# Patient Record
Sex: Female | Born: 1945
Health system: Southern US, Community
[De-identification: ages and names within clinical notes are randomized; demographics above are authoritative.]

## PROBLEM LIST (undated history)

## (undated) DIAGNOSIS — J45909 Unspecified asthma, uncomplicated: Secondary | ICD-10-CM

## (undated) DIAGNOSIS — C4490 Unspecified malignant neoplasm of skin, unspecified: Secondary | ICD-10-CM

## (undated) DIAGNOSIS — I10 Essential (primary) hypertension: Secondary | ICD-10-CM

## (undated) DIAGNOSIS — E039 Hypothyroidism, unspecified: Secondary | ICD-10-CM

## (undated) DIAGNOSIS — Z973 Presence of spectacles and contact lenses: Secondary | ICD-10-CM

## (undated) DIAGNOSIS — I4891 Unspecified atrial fibrillation: Secondary | ICD-10-CM

## (undated) DIAGNOSIS — E785 Hyperlipidemia, unspecified: Secondary | ICD-10-CM

## (undated) DIAGNOSIS — M1990 Unspecified osteoarthritis, unspecified site: Secondary | ICD-10-CM

## (undated) DIAGNOSIS — R011 Cardiac murmur, unspecified: Secondary | ICD-10-CM

## (undated) HISTORY — PX: TUBAL LIGATION: SHX77

## (undated) HISTORY — PX: COLONOSCOPY: SHX174

## (undated) HISTORY — PX: SKIN CANCER EXCISION: SHX779

---

## 2000-01-05 HISTORY — PX: ABDOMINAL HYSTERECTOMY: SHX81

## 2003-01-05 HISTORY — PX: CARPAL TUNNEL RELEASE: SHX101

## 2010-01-04 HISTORY — PX: TOTAL SHOULDER ARTHROPLASTY: SHX126

## 2012-01-05 HISTORY — PX: GREAT TOE ARTHRODESIS, INTERPHALANGEAL JOINT: SUR55

## 2013-01-04 HISTORY — PX: PLANTAR FASCIA SURGERY: SHX746

## 2013-01-04 HISTORY — PX: GREAT TOE ARTHRODESIS, JONES PROCEDURE: SUR57

## 2013-08-24 ENCOUNTER — Other Ambulatory Visit: Payer: Self-pay | Admitting: Orthopedic Surgery

## 2013-08-27 ENCOUNTER — Encounter (HOSPITAL_BASED_OUTPATIENT_CLINIC_OR_DEPARTMENT_OTHER): Payer: Self-pay | Admitting: *Deleted

## 2013-08-27 NOTE — Progress Notes (Signed)
To go to [facility] for [id number] 

## 2013-08-28 ENCOUNTER — Encounter (HOSPITAL_COMMUNITY)
Admission: RE | Admit: 2013-08-28 | Discharge: 2013-08-28 | Disposition: A | Discharge status: Home / Self Care | Payer: Medicare Other | Source: Ambulatory Visit | Attending: Orthopedic Surgery | Admitting: Orthopedic Surgery

## 2013-08-28 DIAGNOSIS — I10 Essential (primary) hypertension: Secondary | ICD-10-CM | POA: Diagnosis not present

## 2013-08-28 DIAGNOSIS — M19049 Primary osteoarthritis, unspecified hand: Secondary | ICD-10-CM | POA: Diagnosis not present

## 2013-08-28 DIAGNOSIS — J45909 Unspecified asthma, uncomplicated: Secondary | ICD-10-CM | POA: Diagnosis not present

## 2013-08-28 DIAGNOSIS — M720 Palmar fascial fibromatosis [Dupuytren]: Secondary | ICD-10-CM | POA: Diagnosis present

## 2013-08-28 DIAGNOSIS — E039 Hypothyroidism, unspecified: Secondary | ICD-10-CM | POA: Diagnosis not present

## 2013-08-28 DIAGNOSIS — R011 Cardiac murmur, unspecified: Secondary | ICD-10-CM | POA: Diagnosis not present

## 2013-08-28 LAB — BASIC METABOLIC PANEL
Anion gap: 12 (ref 5–15)
BUN: 23 mg/dL (ref 6–23)
CALCIUM: 10.2 mg/dL (ref 8.4–10.5)
CHLORIDE: 99 meq/L (ref 96–112)
CO2: 33 meq/L — AB (ref 19–32)
CREATININE: 1.15 mg/dL — AB (ref 0.50–1.10)
GFR calc Af Amer: 55 mL/min — ABNORMAL LOW (ref >90)
GFR calc non Af Amer: 47 mL/min — ABNORMAL LOW (ref >90)
Glucose, Bld: 82 mg/dL (ref 70–99)
Potassium: 3.8 mEq/L (ref 3.7–5.3)
Sodium: 144 mEq/L (ref 137–147)

## 2013-08-28 LAB — HEMOGLOBIN: Hemoglobin: 13.6 g/dL (ref 12.0–15.0)

## 2013-08-30 ENCOUNTER — Encounter (HOSPITAL_BASED_OUTPATIENT_CLINIC_OR_DEPARTMENT_OTHER): Payer: Medicare Other | Admitting: Certified Registered"

## 2013-08-30 ENCOUNTER — Ambulatory Visit (HOSPITAL_BASED_OUTPATIENT_CLINIC_OR_DEPARTMENT_OTHER): Payer: Medicare Other | Admitting: Certified Registered"

## 2013-08-30 ENCOUNTER — Ambulatory Visit (HOSPITAL_BASED_OUTPATIENT_CLINIC_OR_DEPARTMENT_OTHER)
Admission: RE | Admit: 2013-08-30 | Discharge: 2013-08-30 | Disposition: A | Discharge status: Home / Self Care | Payer: Medicare Other | Source: Ambulatory Visit | Attending: Orthopedic Surgery | Admitting: Orthopedic Surgery

## 2013-08-30 ENCOUNTER — Encounter (HOSPITAL_BASED_OUTPATIENT_CLINIC_OR_DEPARTMENT_OTHER): Payer: Self-pay

## 2013-08-30 ENCOUNTER — Encounter (HOSPITAL_BASED_OUTPATIENT_CLINIC_OR_DEPARTMENT_OTHER)
Admission: RE | Disposition: A | Discharge status: Home / Self Care | Payer: Self-pay | Source: Ambulatory Visit | Attending: Orthopedic Surgery

## 2013-08-30 DIAGNOSIS — J45909 Unspecified asthma, uncomplicated: Secondary | ICD-10-CM | POA: Insufficient documentation

## 2013-08-30 DIAGNOSIS — I10 Essential (primary) hypertension: Secondary | ICD-10-CM | POA: Diagnosis not present

## 2013-08-30 DIAGNOSIS — M19049 Primary osteoarthritis, unspecified hand: Secondary | ICD-10-CM | POA: Insufficient documentation

## 2013-08-30 DIAGNOSIS — M720 Palmar fascial fibromatosis [Dupuytren]: Secondary | ICD-10-CM | POA: Diagnosis not present

## 2013-08-30 DIAGNOSIS — R011 Cardiac murmur, unspecified: Secondary | ICD-10-CM | POA: Insufficient documentation

## 2013-08-30 DIAGNOSIS — E039 Hypothyroidism, unspecified: Secondary | ICD-10-CM | POA: Diagnosis not present

## 2013-08-30 HISTORY — DX: Essential (primary) hypertension: I10

## 2013-08-30 HISTORY — DX: Hypothyroidism, unspecified: E03.9

## 2013-08-30 HISTORY — DX: Cardiac murmur, unspecified: R01.1

## 2013-08-30 HISTORY — DX: Unspecified osteoarthritis, unspecified site: M19.90

## 2013-08-30 HISTORY — DX: Unspecified asthma, uncomplicated: J45.909

## 2013-08-30 HISTORY — DX: Presence of spectacles and contact lenses: Z97.3

## 2013-08-30 LAB — POCT HEMOGLOBIN-HEMACUE: HEMOGLOBIN: 15.1 g/dL — AB (ref 12.0–15.0)

## 2013-08-30 SURGERY — FASCIECTOMY, PALM
Anesthesia: Monitor Anesthesia Care | Site: Hand | Laterality: Left

## 2013-08-30 MED ORDER — FENTANYL CITRATE 0.05 MG/ML IJ SOLN
INTRAMUSCULAR | Status: DC | PRN
  Administered 2013-08-30: 100 ug via INTRAVENOUS

## 2013-08-30 MED ORDER — FENTANYL CITRATE 0.05 MG/ML IJ SOLN
INTRAMUSCULAR | Status: AC
  Filled 2013-08-30: qty 2

## 2013-08-30 MED ORDER — LIDOCAINE HCL (CARDIAC) 20 MG/ML IV SOLN
INTRAVENOUS | Status: DC | PRN
  Administered 2013-08-30: 50 mg via INTRAVENOUS

## 2013-08-30 MED ORDER — MIDAZOLAM HCL 2 MG/2ML IJ SOLN
INTRAMUSCULAR | Status: AC
Start: 2013-08-30 — End: 2013-08-30
  Filled 2013-08-30: qty 2

## 2013-08-30 MED ORDER — HYDROCODONE-ACETAMINOPHEN 10-325 MG PO TABS: 1.0000 | ORAL_TABLET | Freq: Four times a day (QID) | ORAL | Status: DC | PRN

## 2013-08-30 MED ORDER — LACTATED RINGERS IV SOLN
INTRAVENOUS | Status: DC
  Administered 2013-08-30 (×2): via INTRAVENOUS

## 2013-08-30 MED ORDER — PROPOFOL 10 MG/ML IV BOLUS
INTRAVENOUS | Status: DC | PRN
  Administered 2013-08-30: 150 mg via INTRAVENOUS
  Administered 2013-08-30: 50 mg via INTRAVENOUS

## 2013-08-30 MED ORDER — SULFAMETHOXAZOLE-TMP DS 800-160 MG PO TABS: 1.0000 | ORAL_TABLET | Freq: Two times a day (BID) | ORAL | Status: DC

## 2013-08-30 MED ORDER — MIDAZOLAM HCL 5 MG/5ML IJ SOLN
INTRAMUSCULAR | Status: DC | PRN
  Administered 2013-08-30: 2 mg via INTRAVENOUS

## 2013-08-30 MED ORDER — CEFAZOLIN SODIUM-DEXTROSE 2-3 GM-% IV SOLR: 2.0000 g | INTRAVENOUS | Status: DC

## 2013-08-30 MED ORDER — METOCLOPRAMIDE HCL 5 MG/ML IJ SOLN
INTRAMUSCULAR | Status: AC
  Filled 2013-08-30: qty 2

## 2013-08-30 MED ORDER — OXYCODONE HCL 5 MG PO TABS: 5.0000 mg | ORAL_TABLET | Freq: Once | ORAL | Status: DC | PRN

## 2013-08-30 MED ORDER — OXYCODONE HCL 5 MG/5ML PO SOLN: 5.0000 mg | Freq: Once | ORAL | Status: DC | PRN

## 2013-08-30 MED ORDER — ONDANSETRON HCL 4 MG/2ML IJ SOLN: 4.0000 mg | Freq: Four times a day (QID) | INTRAMUSCULAR | Status: DC | PRN

## 2013-08-30 MED ORDER — CHLORHEXIDINE GLUCONATE 4 % EX LIQD: 60.0000 mL | Freq: Once | CUTANEOUS | Status: DC

## 2013-08-30 MED ORDER — MIDAZOLAM HCL 2 MG/2ML IJ SOLN
INTRAMUSCULAR | Status: AC
  Filled 2013-08-30: qty 2

## 2013-08-30 MED ORDER — METOCLOPRAMIDE HCL 5 MG/5ML PO SOLN
10.0000 mg | Freq: Three times a day (TID) | ORAL | Status: DC
  Administered 2013-08-30: 5 mg via ORAL

## 2013-08-30 MED ORDER — FENTANYL CITRATE 0.05 MG/ML IJ SOLN
25.0000 ug | INTRAMUSCULAR | Status: DC | PRN
  Administered 2013-08-30 (×2): 50 ug via INTRAVENOUS

## 2013-08-30 MED ORDER — THROMBIN 20000 UNITS EX KIT
PACK | CUTANEOUS | Status: DC | PRN
  Administered 2013-08-30: 20000 [IU] via TOPICAL

## 2013-08-30 MED ORDER — DEXAMETHASONE SODIUM PHOSPHATE 10 MG/ML IJ SOLN
INTRAMUSCULAR | Status: DC | PRN
  Administered 2013-08-30: 10 mg via INTRAVENOUS

## 2013-08-30 MED ORDER — MIDAZOLAM HCL 2 MG/2ML IJ SOLN
1.0000 mg | INTRAMUSCULAR | Status: DC | PRN
  Administered 2013-08-30: 1 mg via INTRAVENOUS

## 2013-08-30 MED ORDER — EPHEDRINE SULFATE 50 MG/ML IJ SOLN
INTRAMUSCULAR | Status: DC | PRN
  Administered 2013-08-30 (×3): 10 mg via INTRAVENOUS

## 2013-08-30 MED ORDER — BUPIVACAINE-EPINEPHRINE (PF) 0.5% -1:200000 IJ SOLN
INTRAMUSCULAR | Status: DC | PRN
  Administered 2013-08-30: 30 mL via PERINEURAL

## 2013-08-30 MED ORDER — CEFAZOLIN SODIUM-DEXTROSE 2-3 GM-% IV SOLR
2.0000 g | INTRAVENOUS | Status: AC
  Administered 2013-08-30: 2 g via INTRAVENOUS

## 2013-08-30 MED ORDER — CEFAZOLIN SODIUM-DEXTROSE 2-3 GM-% IV SOLR
INTRAVENOUS | Status: AC
  Filled 2013-08-30: qty 50

## 2013-08-30 MED ORDER — FENTANYL CITRATE 0.05 MG/ML IJ SOLN
50.0000 ug | INTRAMUSCULAR | Status: DC | PRN
  Administered 2013-08-30: 50 ug via INTRAVENOUS

## 2013-08-30 MED ORDER — ONDANSETRON HCL 4 MG/2ML IJ SOLN
INTRAMUSCULAR | Status: DC | PRN
  Administered 2013-08-30: 4 mg via INTRAVENOUS

## 2013-08-30 MED ORDER — FENTANYL CITRATE 0.05 MG/ML IJ SOLN
INTRAMUSCULAR | Status: AC
  Filled 2013-08-30: qty 4

## 2013-08-30 SURGICAL SUPPLY — 47 items
BLADE MINI RND TIP GREEN BEAV (BLADE) ×2 IMPLANT
BLADE SURG 15 STRL LF DISP TIS (BLADE) ×2 IMPLANT
BLADE SURG 15 STRL SS (BLADE) ×2
BNDG COHESIVE 3X5 TAN STRL LF (GAUZE/BANDAGES/DRESSINGS) ×2 IMPLANT
BNDG ESMARK 4X9 LF (GAUZE/BANDAGES/DRESSINGS) ×2 IMPLANT
BNDG GAUZE ELAST 4 BULKY (GAUZE/BANDAGES/DRESSINGS) ×2 IMPLANT
CHLORAPREP W/TINT 26ML (MISCELLANEOUS) ×2 IMPLANT
CORDS BIPOLAR (ELECTRODE) ×2 IMPLANT
COVER MAYO STAND STRL (DRAPES) ×2 IMPLANT
COVER TABLE BACK 60X90 (DRAPES) ×2 IMPLANT
CUFF TOURNIQUET SINGLE 18IN (TOURNIQUET CUFF) ×2 IMPLANT
DECANTER SPIKE VIAL GLASS SM (MISCELLANEOUS) IMPLANT
DRAPE EXTREMITY T 121X128X90 (DRAPE) ×2 IMPLANT
DRAPE SURG 17X23 STRL (DRAPES) ×2 IMPLANT
DRSG KUZMA FLUFF (GAUZE/BANDAGES/DRESSINGS) IMPLANT
GAUZE SPONGE 4X4 12PLY STRL (GAUZE/BANDAGES/DRESSINGS) ×2 IMPLANT
GAUZE XEROFORM 1X8 LF (GAUZE/BANDAGES/DRESSINGS) ×2 IMPLANT
GLOVE BIOGEL PI IND STRL 7.0 (GLOVE) ×1 IMPLANT
GLOVE BIOGEL PI IND STRL 7.5 (GLOVE) ×1 IMPLANT
GLOVE BIOGEL PI IND STRL 8.5 (GLOVE) ×1 IMPLANT
GLOVE BIOGEL PI INDICATOR 7.0 (GLOVE) ×1
GLOVE BIOGEL PI INDICATOR 7.5 (GLOVE) ×1
GLOVE BIOGEL PI INDICATOR 8.5 (GLOVE) ×1
GLOVE ECLIPSE 6.5 STRL STRAW (GLOVE) ×2 IMPLANT
GLOVE SURG ORTHO 8.0 STRL STRW (GLOVE) ×4 IMPLANT
GOWN STRL REUS W/ TWL LRG LVL3 (GOWN DISPOSABLE) ×1 IMPLANT
GOWN STRL REUS W/TWL LRG LVL3 (GOWN DISPOSABLE) ×1
GOWN STRL REUS W/TWL XL LVL3 (GOWN DISPOSABLE) ×4 IMPLANT
LOOP VESSEL MAXI BLUE (MISCELLANEOUS) ×2 IMPLANT
NS IRRIG 1000ML POUR BTL (IV SOLUTION) ×2 IMPLANT
PACK BASIN DAY SURGERY FS (CUSTOM PROCEDURE TRAY) ×2 IMPLANT
PAD CAST 3X4 CTTN HI CHSV (CAST SUPPLIES) ×1 IMPLANT
PADDING CAST ABS 3INX4YD NS (CAST SUPPLIES)
PADDING CAST ABS 4INX4YD NS (CAST SUPPLIES) ×1
PADDING CAST ABS COTTON 3X4 (CAST SUPPLIES) IMPLANT
PADDING CAST ABS COTTON 4X4 ST (CAST SUPPLIES) ×1 IMPLANT
PADDING CAST COTTON 3X4 STRL (CAST SUPPLIES) ×1
SLEEVE SCD COMPRESS KNEE MED (MISCELLANEOUS) IMPLANT
SPLINT PLASTER CAST XFAST 3X15 (CAST SUPPLIES) IMPLANT
SPLINT PLASTER XTRA FASTSET 3X (CAST SUPPLIES)
STOCKINETTE 4X48 STRL (DRAPES) ×2 IMPLANT
SUT SILK 2 0 FS (SUTURE) ×2 IMPLANT
SUT VICRYL RAPIDE 4/0 PS 2 (SUTURE) ×2 IMPLANT
SYR BULB 3OZ (MISCELLANEOUS) ×2 IMPLANT
SYR CONTROL 10ML LL (SYRINGE) ×2 IMPLANT
TOWEL OR 17X24 6PK STRL BLUE (TOWEL DISPOSABLE) ×4 IMPLANT
UNDERPAD 30X30 INCONTINENT (UNDERPADS AND DIAPERS) ×2 IMPLANT

## 2013-08-30 NOTE — Progress Notes (Signed)
Assisted Dr. Hodierne with left, ultrasound guided, supraclavicular block. Side rails up, monitors on throughout procedure. See vital signs in flow sheet. Tolerated Procedure well. 

## 2013-08-30 NOTE — Transfer of Care (Signed)
Immediate Anesthesia Transfer of Care Note  Patient: Melissa Faulkner  Procedure(s) Performed: Procedure(s): FASCIECTOMY LEFT INDEX, LEFT SMALL, 1ST WEB SPACE (Left)  Patient Location: PACU  Anesthesia Type:General and Regional  Level of Consciousness: sedated  Airway & Oxygen Therapy: Patient Spontanous Breathing and Patient connected to face mask oxygen  Post-op Assessment: Report given to PACU RN and Post -op Vital signs reviewed and stable  Post vital signs: Reviewed and stable  Complications: No apparent anesthesia complications

## 2013-08-30 NOTE — Op Note (Signed)
Dictation Number [id number]

## 2013-08-30 NOTE — Anesthesia Procedure Notes (Addendum)
Anesthesia Regional Block:  Supraclavicular block  Pre-Anesthetic Checklist: ,, timeout performed, Correct Patient, Correct Site, Correct Laterality, Correct Procedure, Correct Position, site marked, Risks and benefits discussed,  Surgical consent,  Pre-op evaluation,  At surgeon's request and post-op pain management  Laterality: Left  Prep: chloraprep       Needles:  Injection technique: Single-shot  Needle Type: Echogenic Stimulator Needle     Needle Length: 5cm 5 cm Needle Gauge: 22 and 22 G    Additional Needles:  Procedures: ultrasound guided (picture in chart) and nerve stimulator Supraclavicular block  Nerve Stimulator or Paresthesia:  Response: biceps flexion, 0.45 mA,   Additional Responses:   Narrative:  Start time: 08/30/2013 11:41 AM End time: 08/30/2013 11:53 AM Injection made incrementally with aspirations every 5 mL.  Performed by: Personally  Anesthesiologist: Dr Marcie Bal  Additional Notes: Functioning IV was confirmed and monitors were applied.  A 63mm 22ga Arrow echogenic stimulator needle was used. Sterile prep and drape,hand hygiene and sterile gloves were used.  Negative aspiration and negative test dose prior to incremental administration of local anesthetic. The patient tolerated the procedure well.  Ultrasound guidance: relevent anatomy identified, needle position confirmed, local anesthetic spread visualized around nerve(s), vascular puncture avoided.  Image printed for medical record.    Procedure Name: LMA Insertion Date/Time: 08/30/2013 12:29 PM Performed by: Melynda Ripple D Pre-anesthesia Checklist: Patient identified, Emergency Drugs available, Suction available and Patient being monitored Patient Re-evaluated:Patient Re-evaluated prior to inductionOxygen Delivery Method: Circle System Utilized Preoxygenation: Pre-oxygenation with 100% oxygen Intubation Type: IV induction Ventilation: Mask ventilation without difficulty LMA: LMA  inserted LMA Size: 4.0 Number of attempts: 1 Airway Equipment and Method: bite block Placement Confirmation: positive ETCO2 Tube secured with: Tape Dental Injury: Teeth and Oropharynx as per pre-operative assessment

## 2013-08-30 NOTE — H&P (Signed)
Melissa Faulkner is a 68 year-old right-hand dominant female referred by Dr. Remo Lipps Case for consultation with respect to cysts in her hands felt to be Dupuytren's, this is the left side.  She had x-rays done revealing degenerative arthritis at the carpometacarpal joint and DIP, PIP joints.  She states that these, at the present time are painful where the nodules are especially on her small finger. She is of [place] descent, Dr. Berline Lopes has recently removed lumps from the insole of her foot.   She has had carpal tunnel releases bilaterally in the past at the [facility] in [place].  She complains of severe constant aching type pain, she feels it is gradually getting worse. She has no siblings with similar problems. She does have history of thyroid problems, arthritis, no history of diabetes or gout.  ALLERGIES:   Zocor and Zestril.  MEDICATIONS:    Theo-Dur, amlodipine, HCTZ, Lipitor, levothyroxine and Ambien.    SURGICAL HISTORY:    Hysterectomy, right foot surgery, bilateral carpal tunnel release, right shoulder joint replacement, big toe surgery, left foot surgery.   FAMILY MEDICAL HISTORY:   Negative.  SOCIAL HISTORY:    She does not smoke or drink.  She is married, retired.   REVIEW OF SYSTEMS:     Positive for glasses, high blood pressure, asthma, sleep disorder, otherwise negative 14 points.  Melissa Faulkner is an 68 y.o. female.   Chief Complaint: Dupuytren's contracture HPI: see above  Past Medical History  Diagnosis Date  . Arthritis   . Hypothyroidism   . Asthma   . Hypertension   . Heart murmur     noted in 71 -no echo ever done  . Wears glasses     Past Surgical History  Procedure Laterality Date  . Total shoulder arthroplasty  2012    right-[facility]  . Carpal tunnel release  2005    both rt/lt  . Abdominal hysterectomy  2002  . Tubal ligation    . Colonoscopy    . Great toe arthrodesis, interphalangeal joint  2014    right  . Great toe arthrodesis, Bilski  procedure  2015    left  . Plantar fascia surgery  2015    left    History reviewed. No pertinent family history. Social History:  reports that she has never smoked. She does not have any smokeless tobacco history on file. She reports that she drinks alcohol. She reports that she does not use illicit drugs.  Allergies:  Allergies  Allergen Reactions  . Zestril [Lisinopril]     Lips swelled  . Zocor [Simvastatin]     Leg cramps    Medications Prior to Admission  Medication Sig Dispense Refill  . amLODipine (NORVASC) 5 MG tablet Take 5 mg by mouth daily.      Marland Kitchen amoxicillin (AMOXIL) 500 MG capsule Take 500 mg by mouth as directed. Take 4 before any surgery-shoulder replacement      . aspirin 81 MG tablet Take 81 mg by mouth daily.      Marland Kitchen atorvastatin (LIPITOR) 20 MG tablet Take 20 mg by mouth daily.      . calcium carbonate 1250 MG capsule Take 1,250 mg by mouth 2 (two) times daily with a meal.      . hydrochlorothiazide (HYDRODIURIL) 25 MG tablet Take 25 mg by mouth daily.      . Multiple Vitamins-Minerals (MULTIVITAMIN WITH MINERALS) tablet Take 1 tablet by mouth daily.      . theophylline (THEODUR) 300  MG 12 hr tablet Take 300 mg by mouth 2 (two) times daily.      Marland Kitchen zolpidem (AMBIEN) 10 MG tablet Take 10 mg by mouth at bedtime as needed for sleep.        No results found for this or any previous visit (from the past 48 hour(s)).  No results found.   Pertinent items are noted in HPI.  Blood pressure 158/84, pulse 94, temperature 98.2 F (36.8 C), temperature source Oral, resp. rate 99, height 5\' 5"  (1.651 m), weight 68.04 kg (150 lb), SpO2 99.00%.  General appearance: alert, cooperative and appears stated age Head: Normocephalic, without obvious abnormality Neck: no JVD Resp: clear to auscultation bilaterally Cardio: systolic murmur: holosystolic 2/6, blowing throughout the precordium GI: soft, non-tender; bowel sounds normal; no masses,  no organomegaly Extremities:  contracture index and small left hand Pulses: 2+ and symmetric Skin: Skin color, texture, turgor normal. No rashes or lesions Neurologic: Grossly normal Incision/Wound: na  Assessment/Plan RADIOGRAPHS:    X-rays reveal Eaton stage III, CMC arthritis along with arthritis of PIP and DIP joints.   These were done by Dr. Case on 4/14.  DIAGNOSIS:     Dupuytren's contracture, degenerative arthritis.  She is desirous of having this taken care of and is complaining of mild discomfort especially if she hits the area.  We have discussed with her the various treatment alternatives in that she has cords primarily to the PIP joints we would recommend fasciectomies rather than fasciotomy, aponeurotomy or Xiaflex injection.  The pre, peri and postoperative course were discussed along with the risks and complications.  The patient is aware there is no guarantee with the surgery, possibility of infection, recurrence, injury to arteries, nerves, tendons, incomplete relief of symptoms and dystrophy, the possibility of stiffness, loss of either flexion or extension, loss of a finger.  She would like to proceed.  She is scheduled for fasciectomy of index, small fingers, left hand along with first web space  Melissa Faulkner 08/30/2013, 10:24 AM

## 2013-08-30 NOTE — Anesthesia Preprocedure Evaluation (Signed)
Anesthesia Evaluation  Patient identified by MRN, date of birth, ID band Patient awake    Reviewed: Allergy & Precautions, H&P , NPO status , Patient's Chart, lab work & pertinent test results  Airway Mallampati: II  Neck ROM: full    Dental   Pulmonary asthma ,          Cardiovascular hypertension,     Neuro/Psych    GI/Hepatic   Endo/Other  Hypothyroidism   Renal/GU      Musculoskeletal  (+) Arthritis -,   Abdominal   Peds  Hematology   Anesthesia Other Findings   Reproductive/Obstetrics                           Anesthesia Physical Anesthesia Plan  ASA: II  Anesthesia Plan: MAC and Regional   Post-op Pain Management: MAC Combined w/ Regional for Post-op pain   Induction: Intravenous  Airway Management Planned: Simple Face Mask  Additional Equipment:   Intra-op Plan:   Post-operative Plan:   Informed Consent: I have reviewed the patients History and Physical, chart, labs and discussed the procedure including the risks, benefits and alternatives for the proposed anesthesia with the patient or authorized representative who has indicated his/her understanding and acceptance.     Plan Discussed with: CRNA, Anesthesiologist and Surgeon  Anesthesia Plan Comments:         Anesthesia Quick Evaluation

## 2013-08-30 NOTE — Brief Op Note (Signed)
08/30/2013  1:58 PM  PATIENT:  Melissa Faulkner  68 y.o. female  PRE-OPERATIVE DIAGNOSIS:  DEPUYTREN'S CONTRACTURE LEFT HAND 1ST WEB INDEX SMALL  POST-OPERATIVE DIAGNOSIS:  DEPUYTREN'S CONTRACTURE LEFT HAND 1ST WEB INDEX SMALL  PROCEDURE:  Procedure(s): FASCIECTOMY LEFT INDEX, LEFT SMALL, 1ST WEB SPACE (Left)  SURGEON:  Surgeon(s) and Role:    * Daryll Brod, MD - Primary    * Leanora Cover, MD - Assisting  PHYSICIAN ASSISTANT:   ASSISTANTS: K Gergory Biello,MD   ANESTHESIA:   regional and general  EBL:  Total I/O In: 1300 [I.V.:1300] Out: -   BLOOD ADMINISTERED:none  DRAINS: Penrose drain in the hand   LOCAL MEDICATIONS USED:  NONE  SPECIMEN:  Excision  DISPOSITION OF SPECIMEN:  PATHOLOGY  COUNTS:  YES  TOURNIQUET:   Total Tourniquet Time Documented: Upper Arm (Left) - 67 minutes Total: Upper Arm (Left) - 67 minutes   DICTATION: .Other Dictation: Dictation Number [id number]  PLAN OF CARE: Discharge to home after PACU  PATIENT DISPOSITION:  PACU - hemodynamically stable.

## 2013-08-30 NOTE — Discharge Instructions (Addendum)
[facility] Instructions °Hand Surgery ° °Wound Care: °Keep your hand elevated above the level of your heart.  Do not allow it to dangle by your side.  Keep the dressing dry and do not remove it unless your doctor advises you to do so.  He will usually change it at the time of your post-op visit.  Moving your fingers is advised to stimulate circulation but will depend on the site of your surgery.  If you have a splint applied, your doctor will advise you regarding movement. ° °Activity: °Do not drive or operate machinery today.  Rest today and then you may return to your normal activity and work as indicated by your physician. ° °Diet:  °Drink liquids today or eat a light diet.  You may resume a regular diet tomorrow.   ° °General expectations: °Pain for two to three days. °Fingers may become slightly swollen. ° °Call your doctor if any of the following occur: °Severe pain not relieved by pain medication. °Elevated temperature. °Dressing soaked with blood. °Inability to move fingers. °White or bluish color to fingers. ° ° °Post Anesthesia Home Care Instructions ° °Activity: °Get plenty of rest for the remainder of the day. A responsible adult should stay with you for 24 hours following the procedure.  °For the next 24 hours, DO NOT: °-Drive a car °-Operate machinery °-Drink alcoholic beverages °-Take any medication unless instructed by your physician °-Make any legal decisions or sign important papers. ° °Meals: °Start with liquid foods such as gelatin or soup. Progress to regular foods as tolerated. Avoid greasy, spicy, heavy foods. If nausea and/or vomiting occur, drink only clear liquids until the nausea and/or vomiting subsides. Call your physician if vomiting continues. ° °Special Instructions/Symptoms: °Your throat may feel dry or sore from the anesthesia or the breathing tube placed in your throat during surgery. If this causes discomfort, gargle with warm salt water. The discomfort should disappear within 24  hours. ° ° °Regional Anesthesia Blocks ° °1. Numbness or the inability to move the "blocked" extremity may last from 3-48 hours after placement. The length of time depends on the medication injected and your individual response to the medication. If the numbness is not going away after 48 hours, call your surgeon. ° °2. The extremity that is blocked will need to be protected until the numbness is gone and the  Strength has returned. Because you cannot feel it, you will need to take extra care to avoid injury. Because it may be weak, you may have difficulty moving it or using it. You may not know what position it is in without looking at it while the block is in effect. ° °3. For blocks in the legs and feet, returning to weight bearing and walking needs to be done carefully. You will need to wait until the numbness is entirely gone and the strength has returned. You should be able to move your leg and foot normally before you try and bear weight or walk. You will need someone to be with you when you first try to ensure you do not fall and possibly risk injury. ° °4. Bruising and tenderness at the needle site are common side effects and will resolve in a few days. ° °5. Persistent numbness or new problems with movement should be communicated to the surgeon or the [facility] ([phone])/ [facility] ([phone]). °

## 2013-08-31 NOTE — Anesthesia Postprocedure Evaluation (Signed)
Anesthesia Post Note  Patient: Melissa Faulkner  Procedure(s) Performed: Procedure(s) (LRB): FASCIECTOMY LEFT INDEX, LEFT SMALL, 1ST WEB SPACE (Left)  Anesthesia type: General  Patient location: PACU  Post pain: Pain level controlled and Adequate analgesia  Post assessment: Post-op Vital signs reviewed, Patient's Cardiovascular Status Stable, Respiratory Function Stable, Patent Airway and Pain level controlled  Last Vitals:  Filed Vitals:   08/30/13 1524  BP: 125/70  Pulse: 103  Temp: 36.4 C  Resp:     Post vital signs: Reviewed and stable  Level of consciousness: awake, alert  and oriented  Complications: No apparent anesthesia complications

## 2013-08-31 NOTE — Op Note (Signed)
NAMEMarland Kitchen  PIETRA, ZULUAGA NO.:  [id number]  MEDICAL RECORD NO.:  [id number]  LOCATION:                                 FACILITY:  PHYSICIAN:  Daryll Brod, M.D.       DATE OF BIRTH:  06-27-1945  DATE OF PROCEDURE:  08/30/2013 DATE OF DISCHARGE:                              OPERATIVE REPORT   PREOPERATIVE DIAGNOSIS:  Dupuytren's contracture, left small and left first web space.  POSTOPERATIVE DIAGNOSIS:  Dupuytren's contracture, left small and left first web space.  PROCEDURE:  Fasciotomy of left index, small, and first web space; left hand.  SURGEON:  Daryll Brod, M.D.  ASSISTANT:  Leanora Cover, MD  ANESTHESIA:  Supraclavicular block with general.  ANESTHESIOLOGIST:  Albertha Ghee, MD.  HISTORY:  The patient is a 68 year old female with a history of Dupuytren's contracture.  This affects her left index, small, and first web space with contractures of the PIP joints.  She desirous proceeding to have this removed with fasciectomy rather than fasciotomy.  She also had discussion of XIAFLEX injection and aponeurotomies.  Risks and complications of each have been discussed at length with her.  She is aware that there is no guarantee with the surgery.  The possibility of infection, recurrence of injury to arteries, nerves, tendons, incomplete relief of symptoms, dystrophy, the probability of recurrence, recurrence rates of the each of the various treatment alternatives have been discussed.  She has elected to proceed with fasciectomy.  Preoperative area of the patient is seen, the extremity was marked by both the patient and surgeon.  Antibiotic given.  PROCEDURE IN DETAIL:  The patient was brought to the operating room, where a general anesthetic was carried out without difficulty.  A supraclavicular block, __________ given in the preoperative area by Dr. Marcie Bal.  She was prepped using ChloraPrep in supine position with the left arm free.  A 3-minute dry time  was allowed.  Time-out taken, confirming the patient and procedure.  The limb was exsanguinated with an Esmarch bandage.  Tourniquet placed high and the arm was inflated to 250 mmHg.  The first webspace contracture was addressed first.  The incision was made and this allowed isolation of the contracture without difficulty.  A segment approximately a 0.5 cm in length was removed. This was converted to __________.  This wound was left open at this point in time.  The little finger was attended.  Next, a volar Brunner incision was made from the mid carpus distally out to the level of the middle phalanx of her little finger and carried down through subcutaneous tissue.  Bleeders were electrocauterized with bipolar. Neurovascular bundles were identified proximally for both common digital artery nerve to the ring and small finger and the proper digital nerve to the ulnar aspect of the small finger.  These were traced distally.  A central cord was noted.  This was traced distally along with an abduct digiti quinti cord.  Throughout the procedure, the neurovascular bundles were protected.  The entire cord was taken and removed to the level of the middle phalanx.  This was sent to Pathology.  A separate incision Alyse Low type was made on  the index finger.  A cord was released proximally.  The neurovascular bundle to the index middle and the proper digital nerve to the index radial side were identified.  The cord proceeded distally and had a contribution from the first dorsal interosseous.  The cord was isolated taking care to protect neurovascular bundles on both radial and ulnar aspect.  The entire cord was excised and this was sent to Pathology also.  The wounds were copiously irrigated with saline.  Bleeders were electrocauterized with bipolar.  Neurovascular structures were identified and traced through the entire incision area and found to be intact.  Thrombin spray was then sprayed into each  wound, doubled over vessel loop.  Drains were placed through the base of the wounds and the wounds closed with interrupted 4-0 Vicryl Rapide sutures.  A sterile compressive dressing was applied.  On deflation of the tourniquet, all fingers immediately pinked.  A dorsal splint was applied.  The dressing completed and the patient was taken to the recovery room for observation in satisfactory condition.  She will be discharged home to return to [facility] in 1 week on Vicodin and Septra DS.          ______________________________ Daryll Brod, M.D.     GK/MEDQ  D:  08/30/2013  T:  08/30/2013  Job:  [id number]

## 2013-08-31 NOTE — Anesthesia Postprocedure Evaluation (Deleted)
Anesthesia Post Note  Patient: Melissa Faulkner  Procedure(s) Performed: Procedure(s) (LRB): FASCIECTOMY LEFT INDEX, LEFT SMALL, 1ST WEB SPACE (Left)  Anesthesia type: General  Patient location: PACU  Post pain: Pain level controlled and Adequate analgesia  Post assessment: Post-op Vital signs reviewed, Patient's Cardiovascular Status Stable, Respiratory Function Stable, Patent Airway and Pain level controlled  Last Vitals:  Filed Vitals:   08/30/13 1524  BP: 125/70  Pulse: 103  Temp: 36.4 C  Resp:     Post vital signs: Reviewed and stable  Level of consciousness: awake, alert  and oriented  Complications: No apparent anesthesia complications

## 2015-01-22 DIAGNOSIS — L570 Actinic keratosis: Secondary | ICD-10-CM | POA: Diagnosis not present

## 2015-01-22 DIAGNOSIS — Z85828 Personal history of other malignant neoplasm of skin: Secondary | ICD-10-CM | POA: Diagnosis not present

## 2015-02-05 DIAGNOSIS — H2512 Age-related nuclear cataract, left eye: Secondary | ICD-10-CM | POA: Diagnosis not present

## 2015-02-05 DIAGNOSIS — H2511 Age-related nuclear cataract, right eye: Secondary | ICD-10-CM | POA: Diagnosis not present

## 2015-02-07 DIAGNOSIS — M152 Bouchard's nodes (with arthropathy): Secondary | ICD-10-CM | POA: Diagnosis not present

## 2015-02-07 DIAGNOSIS — M720 Palmar fascial fibromatosis [Dupuytren]: Secondary | ICD-10-CM | POA: Diagnosis not present

## 2015-02-19 DIAGNOSIS — Z789 Other specified health status: Secondary | ICD-10-CM | POA: Diagnosis not present

## 2015-02-19 DIAGNOSIS — E785 Hyperlipidemia, unspecified: Secondary | ICD-10-CM | POA: Diagnosis not present

## 2015-02-19 DIAGNOSIS — G4700 Insomnia, unspecified: Secondary | ICD-10-CM | POA: Diagnosis not present

## 2015-02-19 DIAGNOSIS — E039 Hypothyroidism, unspecified: Secondary | ICD-10-CM | POA: Diagnosis not present

## 2015-02-19 DIAGNOSIS — I10 Essential (primary) hypertension: Secondary | ICD-10-CM | POA: Diagnosis not present

## 2015-03-03 DIAGNOSIS — H2511 Age-related nuclear cataract, right eye: Secondary | ICD-10-CM | POA: Diagnosis not present

## 2015-03-10 DIAGNOSIS — M720 Palmar fascial fibromatosis [Dupuytren]: Secondary | ICD-10-CM | POA: Diagnosis not present

## 2015-03-10 DIAGNOSIS — M19042 Primary osteoarthritis, left hand: Secondary | ICD-10-CM | POA: Diagnosis not present

## 2015-03-14 DIAGNOSIS — E039 Hypothyroidism, unspecified: Secondary | ICD-10-CM | POA: Diagnosis not present

## 2015-03-14 DIAGNOSIS — H2511 Age-related nuclear cataract, right eye: Secondary | ICD-10-CM | POA: Diagnosis not present

## 2015-03-14 DIAGNOSIS — J45909 Unspecified asthma, uncomplicated: Secondary | ICD-10-CM | POA: Diagnosis not present

## 2015-03-14 DIAGNOSIS — E785 Hyperlipidemia, unspecified: Secondary | ICD-10-CM | POA: Diagnosis not present

## 2015-03-14 DIAGNOSIS — Z888 Allergy status to other drugs, medicaments and biological substances status: Secondary | ICD-10-CM | POA: Diagnosis not present

## 2015-03-14 DIAGNOSIS — I10 Essential (primary) hypertension: Secondary | ICD-10-CM | POA: Diagnosis not present

## 2015-04-04 DIAGNOSIS — J45909 Unspecified asthma, uncomplicated: Secondary | ICD-10-CM | POA: Diagnosis not present

## 2015-04-04 DIAGNOSIS — I10 Essential (primary) hypertension: Secondary | ICD-10-CM | POA: Diagnosis not present

## 2015-04-04 DIAGNOSIS — E7800 Pure hypercholesterolemia, unspecified: Secondary | ICD-10-CM | POA: Diagnosis not present

## 2015-04-09 DIAGNOSIS — H2512 Age-related nuclear cataract, left eye: Secondary | ICD-10-CM | POA: Diagnosis not present

## 2015-04-12 ENCOUNTER — Ambulatory Visit (HOSPITAL_COMMUNITY)
Admission: EM | Admit: 2015-04-12 | Discharge: 2015-04-12 | Disposition: A | Discharge status: Home / Self Care | Payer: Medicare Other | Attending: Family Medicine | Admitting: Family Medicine

## 2015-04-12 ENCOUNTER — Encounter (HOSPITAL_COMMUNITY): Payer: Self-pay | Admitting: *Deleted

## 2015-04-12 DIAGNOSIS — N390 Urinary tract infection, site not specified: Secondary | ICD-10-CM | POA: Diagnosis not present

## 2015-04-12 LAB — POCT URINALYSIS DIP (DEVICE)
BILIRUBIN URINE: NEGATIVE
Glucose, UA: NEGATIVE mg/dL
Nitrite: POSITIVE — AB
PH: 6.5 (ref 5.0–8.0)
PROTEIN: 100 mg/dL — AB
Specific Gravity, Urine: 1.01 (ref 1.005–1.030)
Urobilinogen, UA: 0.2 mg/dL (ref 0.0–1.0)

## 2015-04-12 MED ORDER — CEPHALEXIN 500 MG PO CAPS: 500.0000 mg | ORAL_CAPSULE | Freq: Four times a day (QID) | ORAL | Status: DC

## 2015-04-12 NOTE — Discharge Instructions (Signed)
Take all of medicine as directed, drink lots of fluids, see your doctor if further problems. °

## 2015-04-12 NOTE — ED Provider Notes (Signed)
CSN: [id number]     Arrival date & time 04/12/15  1909 History   First MD Initiated Contact with Patient 04/12/15 2016     Chief Complaint  Patient presents with  . Urinary Tract Infection   (Consider location/radiation/quality/duration/timing/severity/associated sxs/prior Treatment) Patient is a 70 y.o. female presenting with urinary tract infection. The history is provided by the patient.  Urinary Tract Infection Pain quality:  Burning Pain severity:  Moderate Onset quality:  Gradual Duration:  3 days Progression:  Unchanged Chronicity:  New Recent urinary tract infections: no   Relieved by:  None tried Worsened by:  Nothing tried Ineffective treatments:  None tried Urinary symptoms: frequent urination   Associated symptoms: abdominal pain and nausea   Associated symptoms: no fever, no flank pain and no vomiting     Past Medical History  Diagnosis Date  . Arthritis   . Hypothyroidism   . Asthma   . Hypertension   . Heart murmur     noted in 71 -no echo ever done  . Wears glasses    Past Surgical History  Procedure Laterality Date  . Total shoulder arthroplasty  2012    right-[facility]  . Carpal tunnel release  2005    both rt/lt  . Abdominal hysterectomy  2002  . Tubal ligation    . Colonoscopy    . Great toe arthrodesis, interphalangeal joint  2014    right  . Great toe arthrodesis, [place] procedure  2015    left  . Plantar fascia surgery  2015    left   History reviewed. No pertinent family history. Social History  Substance Use Topics  . Smoking status: Never Smoker   . Smokeless tobacco: None  . Alcohol Use: Yes     Comment: occ   OB History    No data available     Review of Systems  Constitutional: Positive for appetite change. Negative for fever.  Gastrointestinal: Positive for nausea and abdominal pain. Negative for vomiting and diarrhea.  Genitourinary: Positive for dysuria, urgency and frequency. Negative for flank pain.  All other systems  reviewed and are negative.   Allergies  Zestril and Zocor  Home Medications   Prior to Admission medications   Medication Sig Start Date End Date Taking? Authorizing Provider  amLODipine (NORVASC) 5 MG tablet Take 5 mg by mouth daily.    Historical Provider, MD  amoxicillin (AMOXIL) 500 MG capsule Take 500 mg by mouth as directed. Take 4 before any surgery-shoulder replacement    Historical Provider, MD  aspirin 81 MG tablet Take 81 mg by mouth daily.    Historical Provider, MD  atorvastatin (LIPITOR) 20 MG tablet Take 20 mg by mouth daily.    Historical Provider, MD  calcium carbonate 1250 MG capsule Take 1,250 mg by mouth 2 (two) times daily with a meal.    Historical Provider, MD  cephALEXin (KEFLEX) 500 MG capsule Take 1 capsule (500 mg total) by mouth 4 (four) times daily. Take all of medicine and drink lots of fluids 04/12/15   Billy Fischer, MD  hydrochlorothiazide (HYDRODIURIL) 25 MG tablet Take 25 mg by mouth daily.    Historical Provider, MD  HYDROcodone-acetaminophen (NORCO) 10-325 MG per tablet Take 1 tablet by mouth every 6 (six) hours as needed. 08/30/13   Daryll Brod, MD  Multiple Vitamins-Minerals (MULTIVITAMIN WITH MINERALS) tablet Take 1 tablet by mouth daily.    Historical Provider, MD  sulfamethoxazole-trimethoprim (BACTRIM DS) 800-160 MG per tablet Take 1 tablet by  mouth 2 (two) times daily. 08/30/13   Daryll Brod, MD  theophylline (THEODUR) 300 MG 12 hr tablet Take 300 mg by mouth 2 (two) times daily.    Historical Provider, MD  zolpidem (AMBIEN) 10 MG tablet Take 10 mg by mouth at bedtime as needed for sleep.    Historical Provider, MD   Meds Ordered and Administered this Visit  Medications - No data to display  BP 126/74 mmHg  Pulse 120  Temp(Src) 98.7 F (37.1 C) (Oral)  Resp 16  SpO2 98% No data found.   Physical Exam  Constitutional: She is oriented to person, place, and time. She appears well-developed and well-nourished.  Abdominal: Soft. Bowel sounds are  normal. She exhibits no distension and no mass. There is no tenderness. There is no rebound and no guarding.  Neurological: She is alert and oriented to person, place, and time.  Skin: Skin is warm and dry.  Nursing note and vitals reviewed.   ED Course  Procedures (including critical care time)  Labs Review Labs Reviewed  POCT URINALYSIS DIP (DEVICE) - Abnormal; Notable for the following:    Ketones, ur TRACE (*)    Hgb urine dipstick MODERATE (*)    Protein, ur 100 (*)    Nitrite POSITIVE (*)    Leukocytes, UA LARGE (*)    All other components within normal limits    Imaging Review No results found.   Visual Acuity Review  Right Eye Distance:   Left Eye Distance:   Bilateral Distance:    Right Eye Near:   Left Eye Near:    Bilateral Near:         MDM   1. UTI (lower urinary tract infection)    Meds ordered this encounter  Medications  . cephALEXin (KEFLEX) 500 MG capsule    Sig: Take 1 capsule (500 mg total) by mouth 4 (four) times daily. Take all of medicine and drink lots of fluids    Dispense:  20 capsule    Refill:  0       Billy Fischer, MD 04/12/15 2036

## 2015-04-12 NOTE — ED Notes (Signed)
Pt  Reports  Symptoms  Of      Urinary   Tract   Burning  And    Frequency  Of  Urination   With  Onset of  Symptoms      X  2-3  Days

## 2015-04-14 ENCOUNTER — Encounter (HOSPITAL_COMMUNITY): Payer: Self-pay | Admitting: Emergency Medicine

## 2015-04-14 ENCOUNTER — Emergency Department (HOSPITAL_COMMUNITY)
Admission: EM | Admit: 2015-04-14 | Discharge: 2015-04-14 | Disposition: A | Discharge status: Home / Self Care | Payer: Medicare Other | Attending: Emergency Medicine | Admitting: Emergency Medicine

## 2015-04-14 DIAGNOSIS — R011 Cardiac murmur, unspecified: Secondary | ICD-10-CM | POA: Insufficient documentation

## 2015-04-14 DIAGNOSIS — Z7982 Long term (current) use of aspirin: Secondary | ICD-10-CM | POA: Insufficient documentation

## 2015-04-14 DIAGNOSIS — M1990 Unspecified osteoarthritis, unspecified site: Secondary | ICD-10-CM | POA: Insufficient documentation

## 2015-04-14 DIAGNOSIS — N390 Urinary tract infection, site not specified: Secondary | ICD-10-CM | POA: Diagnosis not present

## 2015-04-14 DIAGNOSIS — I10 Essential (primary) hypertension: Secondary | ICD-10-CM | POA: Insufficient documentation

## 2015-04-14 DIAGNOSIS — J45909 Unspecified asthma, uncomplicated: Secondary | ICD-10-CM | POA: Insufficient documentation

## 2015-04-14 DIAGNOSIS — E876 Hypokalemia: Secondary | ICD-10-CM

## 2015-04-14 DIAGNOSIS — R55 Syncope and collapse: Secondary | ICD-10-CM | POA: Diagnosis not present

## 2015-04-14 DIAGNOSIS — Z79899 Other long term (current) drug therapy: Secondary | ICD-10-CM | POA: Diagnosis not present

## 2015-04-14 LAB — URINALYSIS, ROUTINE W REFLEX MICROSCOPIC
BILIRUBIN URINE: NEGATIVE
GLUCOSE, UA: NEGATIVE mg/dL
KETONES UR: NEGATIVE mg/dL
Nitrite: NEGATIVE
PH: 6 (ref 5.0–8.0)
Protein, ur: NEGATIVE mg/dL
SPECIFIC GRAVITY, URINE: 1.011 (ref 1.005–1.030)

## 2015-04-14 LAB — CBC
HEMATOCRIT: 37.2 % (ref 36.0–46.0)
HEMOGLOBIN: 12.6 g/dL (ref 12.0–15.0)
MCH: 29.4 pg (ref 26.0–34.0)
MCHC: 33.9 g/dL (ref 30.0–36.0)
MCV: 86.7 fL (ref 78.0–100.0)
Platelets: 190 10*3/uL (ref 150–400)
RBC: 4.29 MIL/uL (ref 3.87–5.11)
RDW: 13.1 % (ref 11.5–15.5)
WBC: 9.4 10*3/uL (ref 4.0–10.5)

## 2015-04-14 LAB — BASIC METABOLIC PANEL
Anion gap: 12 (ref 5–15)
BUN: 22 mg/dL — AB (ref 6–20)
CALCIUM: 8.9 mg/dL (ref 8.9–10.3)
CHLORIDE: 98 mmol/L — AB (ref 101–111)
CO2: 28 mmol/L (ref 22–32)
CREATININE: 1.08 mg/dL — AB (ref 0.44–1.00)
GFR calc non Af Amer: 50 mL/min — ABNORMAL LOW (ref >60)
GFR, EST AFRICAN AMERICAN: 58 mL/min — AB (ref >60)
GLUCOSE: 115 mg/dL — AB (ref 65–99)
Potassium: 2.6 mmol/L — CL (ref 3.5–5.1)
Sodium: 138 mmol/L (ref 135–145)

## 2015-04-14 LAB — URINE MICROSCOPIC-ADD ON

## 2015-04-14 MED ORDER — POTASSIUM CHLORIDE CRYS ER 20 MEQ PO TBCR: 40.0000 meq | EXTENDED_RELEASE_TABLET | Freq: Every day | ORAL | Status: DC

## 2015-04-14 MED ORDER — POTASSIUM CHLORIDE CRYS ER 20 MEQ PO TBCR
40.0000 meq | EXTENDED_RELEASE_TABLET | Freq: Once | ORAL | Status: AC
  Administered 2015-04-14: 40 meq via ORAL
  Filled 2015-04-14: qty 2

## 2015-04-14 MED ORDER — CEFTRIAXONE SODIUM 1 G IJ SOLR
1.0000 g | Freq: Once | INTRAMUSCULAR | Status: AC
  Administered 2015-04-14: 1 g via INTRAMUSCULAR
  Filled 2015-04-14: qty 10

## 2015-04-14 MED ORDER — LIDOCAINE HCL (PF) 1 % IJ SOLN
INTRAMUSCULAR | Status: AC
  Filled 2015-04-14: qty 5

## 2015-04-14 NOTE — Discharge Instructions (Signed)
Syncope Syncope means a person passes out (faints). The person usually wakes up in less than 5 minutes. It is important to seek medical care for syncope. HOME CARE  Have someone stay with you until you feel normal.  Do not drive, use machines, or play sports until your doctor says it is okay.  Keep all doctor visits as told.  Lie down when you feel like you might pass out. Take deep breaths. Wait until you feel normal before standing up.  Drink enough fluids to keep your pee (urine) clear or pale yellow.  If you take blood pressure or heart medicine, get up slowly. Take several minutes to sit and then stand. GET HELP RIGHT AWAY IF:   You have a severe headache.  You have pain in the chest, belly (abdomen), or back.  You are bleeding from the mouth or butt (rectum).  You have black or tarry poop (stool).  You have an irregular or very fast heartbeat.  You have pain with breathing.  You keep passing out, or you have shaking (seizures) when you pass out.  You pass out when sitting or lying down.  You feel confused.  You have trouble walking.  You have severe weakness.  You have vision problems. If you fainted, call for help (911 in [place] Do not drive yourself to the hospital.   This information is not intended to replace advice given to you by your health care provider. Make sure you discuss any questions you have with your health care provider.   Document Released: 06/09/2007 Document Revised: 05/07/2014 Document Reviewed: 02/19/2011 Elsevier Interactive Patient Education 2016 Else[employer] Inc[employer]  Urinary Tract Infection Urinary tract infections (UTIs) can develop anywhere along your urinary tract. Your urinary tract is your body's drainage system for removing wastes and extra water. Your urinary tract includes two kidneys, two ureters, a bladder, and a urethra. Your kidneys are a pair of bean-shaped organs. Each kidney is about the size of your fist. They are located below  your ribs, one on each side of your spine. CAUSES Infections are caused by microbes, which are microscopic organisms, including fungi, viruses, and bacteria. These organisms are so small that they can only be seen through a microscope. Bacteria are the microbes that most commonly cause UTIs. SYMPTOMS  Symptoms of UTIs may vary by age and gender of the patient and by the location of the infection. Symptoms in young women typically include a frequent and intense urge to urinate and a painful, burning feeling in the bladder or urethra during urination. Older women and men are more likely to be tired, shaky, and weak and have muscle aches and abdominal pain. A fever may mean the infection is in your kidneys. Other symptoms of a kidney infection include pain in your back or sides below the ribs, nausea, and vomiting. DIAGNOSIS To diagnose a UTI, your caregiver will ask you about your symptoms. Your caregiver will also ask you to provide a urine sample. The urine sample will be tested for bacteria and white blood cells. White blood cells are made by your body to help fight infection. TREATMENT  Typically, UTIs can be treated with medication. Because most UTIs are caused by a bacterial infection, they usually can be treated with the use of antibiotics. The choice of antibiotic and length of treatment depend on your symptoms and the type of bacteria causing your infection. HOME CARE INSTRUCTIONS  If you were prescribed antibiotics, take them exactly as your caregiver instructs you. [employer]  the medication even if you feel better after you have only taken some of the medication.  Drink enough water and fluids to keep your urine clear or pale yellow.  Avoid caffeine, tea, and carbonated beverages. They tend to irritate your bladder.  Empty your bladder often. Avoid holding urine for long periods of time.  Empty your bladder before and after sexual intercourse.  After a bowel movement, women should cleanse  from front to back. Use each tissue only once. SEEK MEDICAL CARE IF:   You have back pain.  You develop a fever.  Your symptoms do not begin to resolve within 3 days. SEEK IMMEDIATE MEDICAL CARE IF:   You have severe back pain or lower abdominal pain.  You develop chills.  You have nausea or vomiting.  You have continued burning or discomfort with urination. MAKE SURE YOU:   Understand these instructions.  Will watch your condition.  Will get help right away if you are not doing well or get worse.   This information is not intended to replace advice given to you by your health care provider. Make sure you discuss any questions you have with your health care provider.   Document Released: 09/30/2004 Document Revised: 09/11/2014 Document Reviewed: 01/29/2011 Elsevier Interactive Patient Education 2016 [employer].  Hypokalemia Hypokalemia means that the amount of potassium in the blood is lower than normal.Potassium is a chemical, called an electrolyte, that helps regulate the amount of fluid in the body. It also stimulates muscle contraction and helps nerves function properly.Most of the body's potassium is inside of cells, and only a very small amount is in the blood. Because the amount in the blood is so small, minor changes can be life-threatening. CAUSES  Antibiotics.  Diarrhea or vomiting.  Using laxatives too much, which can cause diarrhea.  Chronic kidney disease.  Water pills (diuretics).  Eating disorders (bulimia).  Low magnesium level.  Sweating a lot. SIGNS AND SYMPTOMS  Weakness.  Constipation.  Fatigue.  Muscle cramps.  Mental confusion.  Skipped heartbeats or irregular heartbeat (palpitations).  Tingling or numbness. DIAGNOSIS  Your health care provider can diagnose hypokalemia with blood tests. In addition to checking your potassium level, your health care provider may also check other lab tests. TREATMENT Hypokalemia can be  treated with potassium supplements taken by mouth or adjustments in your current medicines. If your potassium level is very low, you may need to get potassium through a vein (IV) and be monitored in the hospital. A diet high in potassium is also helpful. Foods high in potassium are:  Nuts, such as peanuts and pistachios.  Seeds, such as sunflower seeds and pumpkin seeds.  Peas, lentils, and lima beans.  Whole grain and bran cereals and breads.  Fresh fruit and vegetables, such as apricots, avocado, bananas, cantaloupe, kiwi, oranges, tomatoes, asparagus, and potatoes.  Orange and tomato juices.  Red meats.  Fruit yogurt. HOME CARE INSTRUCTIONS  Take all medicines as prescribed by your health care provider.  Maintain a healthy diet by including nutritious food, such as fruits, vegetables, nuts, whole grains, and lean meats.  If you are taking a laxative, be sure to follow the directions on the label. SEEK MEDICAL CARE IF:  Your weakness gets worse.  You feel your heart pounding or racing.  You are vomiting or having diarrhea.  You are diabetic and having trouble keeping your blood glucose in the normal range. SEEK IMMEDIATE MEDICAL CARE IF:  You have chest pain, shortness of breath, or  dizziness.  You are vomiting or having diarrhea for more than 2 days.  You faint. MAKE SURE YOU:   Understand these instructions.  Will watch your condition.  Will get help right away if you are not doing well or get worse.   This information is not intended to replace advice given to you by your health care provider. Make sure you discuss any questions you have with your health care provider.   Document Released: 12/21/2004 Document Revised: 01/11/2014 Document Reviewed: 06/23/2012 Elsevier Interactive Patient Education [employer].

## 2015-04-14 NOTE — ED Provider Notes (Signed)
CSN: [id number]     Arrival date & time 04/14/15  0404 History   None    Chief Complaint  Patient presents with  . Near Syncope     (Consider location/radiation/quality/duration/timing/severity/associated sxs/prior Treatment) HPI Comments: Presents to the emergency department for evaluation after syncopal episode. Patient reports that her husband has had a stroke and has been hospitalized for 4 days. She has been staying in the room with him. She reports that she was sleeping and woke up and noticed that he had gotten up to go to the bathroom. He is not supposed to walk without help, so she jumped up to help him go to the bathroom. He urinated and then when she walked away from him she became very hot, dizzy and then passed out. No injuries noted. Patient did not experience any chest pain, shortness of breath, heart palpitations. She reports that she is back to her normal baseline now.   Past Medical History  Diagnosis Date  . Arthritis   . Hypothyroidism   . Asthma   . Hypertension   . Heart murmur     noted in 71 -no echo ever done  . Wears glasses    Past Surgical History  Procedure Laterality Date  . Total shoulder arthroplasty  2012    right-[facility]  . Carpal tunnel release  2005    both rt/lt  . Abdominal hysterectomy  2002  . Tubal ligation    . Colonoscopy    . Great toe arthrodesis, interphalangeal joint  2014    right  . Great toe arthrodesis, [place] procedure  2015    left  . Plantar fascia surgery  2015    left   No family history on file. Social History  Substance Use Topics  . Smoking status: Never Smoker   . Smokeless tobacco: None  . Alcohol Use: Yes     Comment: occ   OB History    No data available     Review of Systems  Neurological: Positive for syncope.  All other systems reviewed and are negative.     Allergies  Zestril and Zocor  Home Medications   Prior to Admission medications   Medication Sig Start Date End Date Taking?  Authorizing Provider  amLODipine (NORVASC) 5 MG tablet Take 5 mg by mouth daily.   Yes Historical Provider, MD  amoxicillin (AMOXIL) 500 MG capsule Take 500 mg by mouth as directed. Take 4 before any surgery-shoulder replacement   Yes Historical Provider, MD  aspirin 81 MG tablet Take 81 mg by mouth daily.   Yes Historical Provider, MD  atorvastatin (LIPITOR) 20 MG tablet Take 20 mg by mouth daily.   Yes Historical Provider, MD  calcium carbonate 1250 MG capsule Take 1,250 mg by mouth 2 (two) times daily with a meal.   Yes Historical Provider, MD  cephALEXin (KEFLEX) 500 MG capsule Take 1 capsule (500 mg total) by mouth 4 (four) times daily. Take all of medicine and drink lots of fluids 04/12/15  Yes Billy Fischer, MD  hydrochlorothiazide (HYDRODIURIL) 25 MG tablet Take 25 mg by mouth daily.   Yes Historical Provider, MD  Multiple Vitamins-Minerals (MULTIVITAMIN WITH MINERALS) tablet Take 1 tablet by mouth daily.   Yes Historical Provider, MD  theophylline (THEODUR) 300 MG 12 hr tablet Take 300 mg by mouth 2 (two) times daily.   Yes Historical Provider, MD  zolpidem (AMBIEN) 10 MG tablet Take 10 mg by mouth at bedtime as needed for sleep.  Yes Historical Provider, MD  HYDROcodone-acetaminophen (NORCO) 10-325 MG per tablet Take 1 tablet by mouth every 6 (six) hours as needed. Patient not taking: Reported on 04/14/2015 08/30/13   Daryll Brod, MD  potassium chloride SA (K-DUR,KLOR-CON) 20 MEQ tablet Take 2 tablets (40 mEq total) by mouth daily. 04/14/15   Orpah Greek, MD  sulfamethoxazole-trimethoprim (BACTRIM DS) 800-160 MG per tablet Take 1 tablet by mouth 2 (two) times daily. Patient not taking: Reported on 04/14/2015 08/30/13   Daryll Brod, MD   BP 111/66 mmHg  Pulse 71  Resp 14  SpO2 95% Physical Exam  Constitutional: She is oriented to person, place, and time. She appears well-developed and well-nourished. No distress.  HENT:  Head: Normocephalic and atraumatic.  Right Ear: Hearing  normal.  Left Ear: Hearing normal.  Nose: Nose normal.  Mouth/Throat: Oropharynx is clear and moist and mucous membranes are normal.  Eyes: Conjunctivae and EOM are normal. Pupils are equal, round, and reactive to light.  Neck: Normal range of motion. Neck supple.  Cardiovascular: Regular rhythm, S1 normal and S2 normal.  Exam reveals no gallop and no friction rub.   No murmur heard. Pulmonary/Chest: Effort normal and breath sounds normal. No respiratory distress. She exhibits no tenderness.  Abdominal: Soft. Normal appearance and bowel sounds are normal. There is no hepatosplenomegaly. There is no tenderness. There is no rebound, no guarding, no tenderness at McBurney's point and negative Murphy's sign. No hernia.  Musculoskeletal: Normal range of motion.  Neurological: She is alert and oriented to person, place, and time. She has normal strength. No cranial nerve deficit or sensory deficit. Coordination normal. GCS eye subscore is 4. GCS verbal subscore is 5. GCS motor subscore is 6.  Skin: Skin is warm, dry and intact. No rash noted. No cyanosis.  Psychiatric: She has a normal mood and affect. Her speech is normal and behavior is normal. Thought content normal.  Nursing note and vitals reviewed.   ED Course  Procedures (including critical care time) Labs Review Labs Reviewed  BASIC METABOLIC PANEL - Abnormal; Notable for the following:    Potassium 2.6 (*)    Chloride 98 (*)    Glucose, Bld 115 (*)    BUN 22 (*)    Creatinine, Ser 1.08 (*)    GFR calc non Af Amer 50 (*)    GFR calc Af Amer 58 (*)    All other components within normal limits  URINALYSIS, ROUTINE W REFLEX MICROSCOPIC (NOT AT [facility]) - Abnormal; Notable for the following:    APPearance CLOUDY (*)    Hgb urine dipstick SMALL (*)    Leukocytes, UA LARGE (*)    All other components within normal limits  URINE MICROSCOPIC-ADD ON - Abnormal; Notable for the following:    Squamous Epithelial / LPF 0-5 (*)    Bacteria, UA  MANY (*)    Casts HYALINE CASTS (*)    All other components within normal limits  URINE CULTURE  CBC    Imaging Review No results found. I have personally reviewed and evaluated these images and lab results as part of my medical decision-making.   EKG Interpretation   Date/Time:  Monday April 14 2015 04:18:39 EDT Ventricular Rate:  75 PR Interval:  183 QRS Duration: 104 QT Interval:  399 QTC Calculation: 446 R Axis:   -59 Text Interpretation:  Age not entered, assumed to be  70 years old for  purpose of ECG interpretation Sinus rhythm Probable left atrial  enlargement Left  anterior fascicular block Probable anterior infarct, age  indeterminate No significant change since last tracing Confirmed by  [facility]  MD, [place] [phone]) on 04/14/2015 5:16:50 AM      MDM   Final diagnoses:  Syncope, unspecified syncope type  Hypokalemia  UTI (lower urinary tract infection)   Patient had an episode of either syncope or near syncope just prior to arrival. It is not clear if she completely passed out, but she became very hot, sweaty, dizzy and became very dazed and blacked out briefly. There was no fall or injury. Workup reveals hypokalemia. She was initiated on potassium supplementation here in the ER and will continue it as an outpatient. She was diagnosed with a UTI one day ago. She has had 1 day of antibiotics and urinalysis still appears to be infected. It's not clear if this is just to see for the antibiotic to clear the infection. A culture was not sent with previous UA, culture sent today. It is too soon to change the antibiotic at this time, therefore she was given Rocephin IM here in the ER before discharge. She is to follow-up with her doctor in 1 week for recheck of potassium.    Orpah Greek, MD 04/14/15 [phone]

## 2015-04-14 NOTE — ED Notes (Signed)
Pt reports she was staying w/ her spouse on the medical floor and had been sleeping, she jumped up to stop him from getting out of bed and "nearly passed out."  She did not hit her head or LOC, however she did bang up her knees.

## 2015-04-15 LAB — URINE CULTURE: Culture: NO GROWTH

## 2015-04-22 DIAGNOSIS — I10 Essential (primary) hypertension: Secondary | ICD-10-CM | POA: Diagnosis not present

## 2015-04-22 DIAGNOSIS — J45909 Unspecified asthma, uncomplicated: Secondary | ICD-10-CM | POA: Diagnosis not present

## 2015-04-22 DIAGNOSIS — E7800 Pure hypercholesterolemia, unspecified: Secondary | ICD-10-CM | POA: Diagnosis not present

## 2015-04-24 DIAGNOSIS — H269 Unspecified cataract: Secondary | ICD-10-CM | POA: Diagnosis not present

## 2015-04-24 DIAGNOSIS — Z9841 Cataract extraction status, right eye: Secondary | ICD-10-CM | POA: Diagnosis not present

## 2015-04-24 DIAGNOSIS — J45909 Unspecified asthma, uncomplicated: Secondary | ICD-10-CM | POA: Diagnosis not present

## 2015-04-24 DIAGNOSIS — Z961 Presence of intraocular lens: Secondary | ICD-10-CM | POA: Diagnosis not present

## 2015-04-24 DIAGNOSIS — E039 Hypothyroidism, unspecified: Secondary | ICD-10-CM | POA: Diagnosis not present

## 2015-04-24 DIAGNOSIS — E785 Hyperlipidemia, unspecified: Secondary | ICD-10-CM | POA: Diagnosis not present

## 2015-04-24 DIAGNOSIS — Z888 Allergy status to other drugs, medicaments and biological substances status: Secondary | ICD-10-CM | POA: Diagnosis not present

## 2015-04-24 DIAGNOSIS — Z7982 Long term (current) use of aspirin: Secondary | ICD-10-CM | POA: Diagnosis not present

## 2015-04-24 DIAGNOSIS — I10 Essential (primary) hypertension: Secondary | ICD-10-CM | POA: Diagnosis not present

## 2015-04-24 DIAGNOSIS — Z79899 Other long term (current) drug therapy: Secondary | ICD-10-CM | POA: Diagnosis not present

## 2015-04-24 DIAGNOSIS — H2512 Age-related nuclear cataract, left eye: Secondary | ICD-10-CM | POA: Diagnosis not present

## 2015-05-16 DIAGNOSIS — J45909 Unspecified asthma, uncomplicated: Secondary | ICD-10-CM | POA: Diagnosis not present

## 2015-05-16 DIAGNOSIS — I10 Essential (primary) hypertension: Secondary | ICD-10-CM | POA: Diagnosis not present

## 2015-05-16 DIAGNOSIS — E7800 Pure hypercholesterolemia, unspecified: Secondary | ICD-10-CM | POA: Diagnosis not present

## 2015-05-20 DIAGNOSIS — I10 Essential (primary) hypertension: Secondary | ICD-10-CM | POA: Diagnosis not present

## 2015-05-20 DIAGNOSIS — G4700 Insomnia, unspecified: Secondary | ICD-10-CM | POA: Diagnosis not present

## 2015-05-20 DIAGNOSIS — Z299 Encounter for prophylactic measures, unspecified: Secondary | ICD-10-CM | POA: Diagnosis not present

## 2015-06-23 DIAGNOSIS — Z96611 Presence of right artificial shoulder joint: Secondary | ICD-10-CM | POA: Diagnosis not present

## 2015-06-23 DIAGNOSIS — Z471 Aftercare following joint replacement surgery: Secondary | ICD-10-CM | POA: Diagnosis not present

## 2015-06-23 DIAGNOSIS — M19011 Primary osteoarthritis, right shoulder: Secondary | ICD-10-CM | POA: Diagnosis not present

## 2015-07-10 DIAGNOSIS — E7800 Pure hypercholesterolemia, unspecified: Secondary | ICD-10-CM | POA: Diagnosis not present

## 2015-07-10 DIAGNOSIS — I10 Essential (primary) hypertension: Secondary | ICD-10-CM | POA: Diagnosis not present

## 2015-07-10 DIAGNOSIS — J45909 Unspecified asthma, uncomplicated: Secondary | ICD-10-CM | POA: Diagnosis not present

## 2015-08-20 DIAGNOSIS — G4700 Insomnia, unspecified: Secondary | ICD-10-CM | POA: Diagnosis not present

## 2015-08-20 DIAGNOSIS — Z6823 Body mass index (BMI) 23.0-23.9, adult: Secondary | ICD-10-CM | POA: Diagnosis not present

## 2015-08-20 DIAGNOSIS — Z713 Dietary counseling and surveillance: Secondary | ICD-10-CM | POA: Diagnosis not present

## 2015-08-20 DIAGNOSIS — I10 Essential (primary) hypertension: Secondary | ICD-10-CM | POA: Diagnosis not present

## 2015-08-28 DIAGNOSIS — I10 Essential (primary) hypertension: Secondary | ICD-10-CM | POA: Diagnosis not present

## 2015-08-28 DIAGNOSIS — E7800 Pure hypercholesterolemia, unspecified: Secondary | ICD-10-CM | POA: Diagnosis not present

## 2015-08-28 DIAGNOSIS — J45909 Unspecified asthma, uncomplicated: Secondary | ICD-10-CM | POA: Diagnosis not present

## 2015-10-06 DIAGNOSIS — J45909 Unspecified asthma, uncomplicated: Secondary | ICD-10-CM | POA: Diagnosis not present

## 2015-10-06 DIAGNOSIS — E7800 Pure hypercholesterolemia, unspecified: Secondary | ICD-10-CM | POA: Diagnosis not present

## 2015-10-06 DIAGNOSIS — I10 Essential (primary) hypertension: Secondary | ICD-10-CM | POA: Diagnosis not present

## 2015-11-19 DIAGNOSIS — H26491 Other secondary cataract, right eye: Secondary | ICD-10-CM | POA: Diagnosis not present

## 2015-11-24 DIAGNOSIS — Z0000 Encounter for general adult medical examination without abnormal findings: Secondary | ICD-10-CM | POA: Diagnosis not present

## 2015-11-24 DIAGNOSIS — E7800 Pure hypercholesterolemia, unspecified: Secondary | ICD-10-CM | POA: Diagnosis not present

## 2015-11-24 DIAGNOSIS — Z1211 Encounter for screening for malignant neoplasm of colon: Secondary | ICD-10-CM | POA: Diagnosis not present

## 2015-11-24 DIAGNOSIS — Z79899 Other long term (current) drug therapy: Secondary | ICD-10-CM | POA: Diagnosis not present

## 2015-11-24 DIAGNOSIS — Z7189 Other specified counseling: Secondary | ICD-10-CM | POA: Diagnosis not present

## 2015-11-24 DIAGNOSIS — Z299 Encounter for prophylactic measures, unspecified: Secondary | ICD-10-CM | POA: Diagnosis not present

## 2015-11-24 DIAGNOSIS — R5383 Other fatigue: Secondary | ICD-10-CM | POA: Diagnosis not present

## 2015-11-24 DIAGNOSIS — Z6823 Body mass index (BMI) 23.0-23.9, adult: Secondary | ICD-10-CM | POA: Diagnosis not present

## 2015-11-24 DIAGNOSIS — E039 Hypothyroidism, unspecified: Secondary | ICD-10-CM | POA: Diagnosis not present

## 2015-11-24 DIAGNOSIS — I4891 Unspecified atrial fibrillation: Secondary | ICD-10-CM | POA: Diagnosis not present

## 2015-11-24 DIAGNOSIS — Z1389 Encounter for screening for other disorder: Secondary | ICD-10-CM | POA: Diagnosis not present

## 2015-11-26 ENCOUNTER — Encounter: Payer: Self-pay | Admitting: *Deleted

## 2015-11-28 DIAGNOSIS — E876 Hypokalemia: Secondary | ICD-10-CM | POA: Diagnosis not present

## 2015-12-01 ENCOUNTER — Encounter: Payer: Self-pay | Admitting: *Deleted

## 2015-12-01 ENCOUNTER — Ambulatory Visit (INDEPENDENT_AMBULATORY_CARE_PROVIDER_SITE_OTHER): Payer: Medicare Other | Admitting: Cardiovascular Disease

## 2015-12-01 ENCOUNTER — Encounter: Payer: Self-pay | Admitting: Cardiovascular Disease

## 2015-12-01 VITALS — BP 146/89 | HR 76 | Ht 63.0 in | Wt 134.4 lb

## 2015-12-01 DIAGNOSIS — Z7189 Other specified counseling: Secondary | ICD-10-CM | POA: Diagnosis not present

## 2015-12-01 DIAGNOSIS — E876 Hypokalemia: Secondary | ICD-10-CM

## 2015-12-01 DIAGNOSIS — I10 Essential (primary) hypertension: Secondary | ICD-10-CM | POA: Diagnosis not present

## 2015-12-01 DIAGNOSIS — I4891 Unspecified atrial fibrillation: Secondary | ICD-10-CM | POA: Diagnosis not present

## 2015-12-01 MED ORDER — APIXABAN 5 MG PO TABS: 5.0000 mg | ORAL_TABLET | Freq: Two times a day (BID) | ORAL | 0 refills | Status: DC

## 2015-12-01 MED ORDER — APIXABAN 5 MG PO TABS: 5.0000 mg | ORAL_TABLET | Freq: Two times a day (BID) | ORAL | 6 refills | Status: DC

## 2015-12-01 NOTE — Progress Notes (Signed)
CARDIOLOGY CONSULT NOTE  Patient ID: Melissa Faulkner MRN: [id number] DOB/AGE: 72/08/1945 72 y.o.  Admit date: (Not on file) Primary Physician: Glenda Chroman, MD Referring Physician:   Reason for Consultation: atrial fibrillation  HPI: 70 year old woman with a history of hypertension and hypothyroidism presents for the evaluation of atrial fibrillation.  ECG 11/24/15 demonstrated fibrillation with a left anterior fascicular block. Recent labs: BUN 18, creatinine 0.95, normal TSH, normal hemoglobin.  She has been experiencing palpitations for the past 2 weeks. When she climbs a flight of stairs she says it takes longer for her heart to "settle down". She has had one episode of brief chest pain lasting seconds. She denies exertional chest pain and dyspnea. Potassium was low at 2.8. She has been on supplementation and labs have been repeated but the results are not available.   Allergies  Allergen Reactions  . Zestril [Lisinopril]     Lips swelled  . Zocor [Simvastatin]     Leg cramps    Current Outpatient Prescriptions  Medication Sig Dispense Refill  . amLODipine (NORVASC) 5 MG tablet Take 5 mg by mouth daily.    Marland Kitchen amoxicillin (AMOXIL) 500 MG capsule Take 500 mg by mouth as directed. Take 4 before any surgery-shoulder replacement    . aspirin 81 MG tablet Take 81 mg by mouth daily.    Marland Kitchen atorvastatin (LIPITOR) 10 MG tablet Take 10 mg by mouth daily.    . B Complex-C (SUPER B COMPLEX PO) Take 1 tablet by mouth daily.    . calcium carbonate 1250 MG capsule Take 1,250 mg by mouth 2 (two) times daily with a meal.    . chlorthalidone (HYGROTON) 25 MG tablet Take 25 mg by mouth daily.    Marland Kitchen CRANBERRY CONCENTRATE PO Take by mouth daily.    Marland Kitchen levothyroxine (SYNTHROID, LEVOTHROID) 25 MCG tablet Take 25 mcg by mouth daily before breakfast.    . Multiple Vitamins-Minerals (MULTIVITAMIN WITH MINERALS) tablet Take 1 tablet by mouth daily.    . Omega 3-6-9 Fatty Acids (OMEGA 3-6-9 COMPLEX  PO) Take 1 capsule by mouth daily.    . potassium chloride SA (K-DUR,KLOR-CON) 20 MEQ tablet Take 20 mEq by mouth daily. X 3 days, then awaiting further instructions based off labs done on Friday (PMD)    . theophylline (THEODUR) 300 MG 12 hr tablet Take 300 mg by mouth 2 (two) times daily.    Marland Kitchen zolpidem (AMBIEN) 10 MG tablet Take 10 mg by mouth at bedtime as needed for sleep.     No current facility-administered medications for this visit.     Past Medical History:  Diagnosis Date  . Arthritis   . Asthma   . Heart murmur    noted in 71 -no echo ever done  . Hypertension   . Hypothyroidism   . Wears glasses     Past Surgical History:  Procedure Laterality Date  . ABDOMINAL HYSTERECTOMY  2002  . CARPAL TUNNEL RELEASE  2005   both rt/lt  . COLONOSCOPY    . GREAT TOE ARTHRODESIS, INTERPHALANGEAL JOINT  2014   right  . GREAT TOE ARTHRODESIS, [place] PROCEDURE  2015   left  . PLANTAR FASCIA SURGERY  2015   left  . TOTAL SHOULDER ARTHROPLASTY  2012   right-[facility]  . TUBAL LIGATION      Social History   Social History  . Marital status: Married    Spouse name: N/A  . Number of children: N/A  .  Years of education: N/A   Occupational History  . Not on file.   Social History Main Topics  . Smoking status: Never Smoker  . Smokeless tobacco: Never Used  . Alcohol use Yes     Comment: occ  . Drug use: No  . Sexual activity: Not on file   Other Topics Concern  . Not on file   Social History Narrative  . No narrative on file     No family history of premature CAD in 1st degree relatives.  Prior to Admission medications   Medication Sig Start Date End Date Taking? Authorizing Provider  amLODipine (NORVASC) 5 MG tablet Take 5 mg by mouth daily.    Historical Provider, MD  amoxicillin (AMOXIL) 500 MG capsule Take 500 mg by mouth as directed. Take 4 before any surgery-shoulder replacement    Historical Provider, MD  aspirin 81 MG tablet Take 81 mg by mouth daily.     Historical Provider, MD  calcium carbonate 1250 MG capsule Take 1,250 mg by mouth 2 (two) times daily with a meal.    Historical Provider, MD  hydrochlorothiazide (HYDRODIURIL) 25 MG tablet Take 25 mg by mouth daily.    Historical Provider, MD  Multiple Vitamins-Minerals (MULTIVITAMIN WITH MINERALS) tablet Take 1 tablet by mouth daily.    Historical Provider, MD  theophylline (THEODUR) 300 MG 12 hr tablet Take 300 mg by mouth 2 (two) times daily.    Historical Provider, MD  zolpidem (AMBIEN) 10 MG tablet Take 10 mg by mouth at bedtime as needed for sleep.    Historical Provider, MD     Review of systems complete and found to be negative unless listed above in HPI     Physical exam Blood pressure (!) 142/95, pulse 81, height 5\' 3"  (1.6 m), weight 134 lb 6.4 oz (61 kg), SpO2 99 %. General: NAD Neck: No JVD, no thyromegaly or thyroid nodule.  Lungs: Clear to auscultation bilaterally with normal respiratory effort. CV: Nondisplaced PMI. Regular rate and irregular rhythm, normal S1/S2, no S3, no murmur.  No peripheral edema.  No carotid bruit.    Abdomen: Soft, nontender, no distention.  Skin: Intact without lesions or rashes.  Neurologic: Alert and oriented x 3.  Psych: Normal affect. Extremities: No clubbing or cyanosis.  HEENT: Normal.   ECG: Most recent ECG reviewed.  Labs:   Lab Results  Component Value Date   WBC 9.4 04/14/2015   HGB 12.6 04/14/2015   HCT 37.2 04/14/2015   MCV 86.7 04/14/2015   PLT 190 04/14/2015   No results for input(s): NA, K, CL, CO2, BUN, CREATININE, CALCIUM, PROT, BILITOT, ALKPHOS, ALT, AST, GLUCOSE in the last 168 hours.  Invalid input(s): LABALBU No results found for: CKTOTAL, CKMB, CKMBINDEX, TROPONINI No results found for: CHOL No results found for: HDL No results found for: LDLCALC No results found for: TRIG No results found for: CHOLHDL No results found for: LDLDIRECT       Studies: No results found.  ASSESSMENT AND PLAN:  1. Atrial  fibrillation: CHADSVASC score of 3, thus anticoagulation indicated. Currently taking aspirin 81 mg. Will stop ASA and start apixaban 5 mg twice daily. I will order a 2-D echocardiogram with Doppler to evaluate cardiac structure, function, and regional wall motion. HR controlled without AV nodal blocking agents, indicative of conduction system disease. Will obtain ECG at follow up visit.  2. Hypertension: Mildly elevated. Will monitor.  3. Hypokalemia: Being repleted. Will obtain labs.  Dispo: fu 4-6 wks  Signed: Kate Sable, M.D., F.A.C.C.  12/01/2015, 11:42 AM

## 2015-12-01 NOTE — Patient Instructions (Addendum)
Medication Instructions:   Begin Eliquis 5mg  twice a day    Stop Aspirin  Continue all other medications.    Labwork: none  Testing/Procedures:  Your physician has requested that you have an echocardiogram. Echocardiography is a painless test that uses sound waves to create images of your heart. It provides your doctor with information about the size and shape of your heart and how well your heart's chambers and valves are working. This procedure takes approximately one hour. There are no restrictions for this procedure.  Office will contact with results via phone or letter.    Follow-Up: 4-6 weeks   Any Other Special Instructions Will Be Listed Below (If Applicable).  If you need a refill on your cardiac medications before your next appointment, please call your pharmacy.

## 2015-12-08 DIAGNOSIS — E876 Hypokalemia: Secondary | ICD-10-CM | POA: Diagnosis not present

## 2015-12-17 ENCOUNTER — Ambulatory Visit (INDEPENDENT_AMBULATORY_CARE_PROVIDER_SITE_OTHER): Payer: Medicare Other

## 2015-12-17 ENCOUNTER — Other Ambulatory Visit: Payer: Self-pay

## 2015-12-17 DIAGNOSIS — I4891 Unspecified atrial fibrillation: Secondary | ICD-10-CM | POA: Diagnosis not present

## 2015-12-30 DIAGNOSIS — E7800 Pure hypercholesterolemia, unspecified: Secondary | ICD-10-CM | POA: Diagnosis not present

## 2015-12-30 DIAGNOSIS — I10 Essential (primary) hypertension: Secondary | ICD-10-CM | POA: Diagnosis not present

## 2015-12-30 DIAGNOSIS — J45909 Unspecified asthma, uncomplicated: Secondary | ICD-10-CM | POA: Diagnosis not present

## 2016-01-06 ENCOUNTER — Ambulatory Visit (INDEPENDENT_AMBULATORY_CARE_PROVIDER_SITE_OTHER): Payer: Medicare Other | Admitting: Cardiovascular Disease

## 2016-01-06 ENCOUNTER — Encounter: Payer: Self-pay | Admitting: Cardiovascular Disease

## 2016-01-06 VITALS — BP 148/88 | HR 88 | Ht 64.0 in | Wt 135.0 lb

## 2016-01-06 DIAGNOSIS — Z7189 Other specified counseling: Secondary | ICD-10-CM | POA: Diagnosis not present

## 2016-01-06 DIAGNOSIS — I4891 Unspecified atrial fibrillation: Secondary | ICD-10-CM | POA: Diagnosis not present

## 2016-01-06 DIAGNOSIS — E876 Hypokalemia: Secondary | ICD-10-CM

## 2016-01-06 DIAGNOSIS — I10 Essential (primary) hypertension: Secondary | ICD-10-CM | POA: Diagnosis not present

## 2016-01-06 MED ORDER — RIVAROXABAN 20 MG PO TABS: 20.0000 mg | ORAL_TABLET | Freq: Every day | ORAL | 6 refills | Status: DC

## 2016-01-06 MED ORDER — RIVAROXABAN 20 MG PO TABS: 20.0000 mg | ORAL_TABLET | Freq: Every day | ORAL | 0 refills | Status: DC

## 2016-01-06 NOTE — Progress Notes (Signed)
SUBJECTIVE: The patient presents for follow-up of atrial fibrillation. She seldom has palpitations. She has had no bleeding problems on Eliquis but her pharmacist told her it will cost over $400 monthly. She has also had joint flares.  Her husband had a stroke and she checks both his and her blood pressure routinely and her pressure runs in the 120/70 range. It was normal at her PCPs office as well.   Review of Systems: As per "subjective", otherwise negative.  Allergies  Allergen Reactions  . Zestril [Lisinopril]     Lips swelled  . Zocor [Simvastatin]     Leg cramps    Current Outpatient Prescriptions  Medication Sig Dispense Refill  . amLODipine (NORVASC) 5 MG tablet Take 5 mg by mouth daily.    Marland Kitchen amoxicillin (AMOXIL) 500 MG capsule Take 500 mg by mouth as directed. Take 4 before any surgery-shoulder replacement    . apixaban (ELIQUIS) 5 MG TABS tablet Take 1 tablet (5 mg total) by mouth 2 (two) times daily. 60 tablet 6  . atorvastatin (LIPITOR) 10 MG tablet Take 10 mg by mouth daily.    . B Complex-C (SUPER B COMPLEX PO) Take 1 tablet by mouth daily.    . calcium carbonate 1250 MG capsule Take 1,250 mg by mouth 2 (two) times daily with a meal.    . chlorthalidone (HYGROTON) 25 MG tablet Take 25 mg by mouth daily.    Marland Kitchen CRANBERRY CONCENTRATE PO Take by mouth daily.    Marland Kitchen levothyroxine (SYNTHROID, LEVOTHROID) 25 MCG tablet Take 25 mcg by mouth daily before breakfast.    . Multiple Vitamins-Minerals (MULTIVITAMIN WITH MINERALS) tablet Take 1 tablet by mouth daily.    . Omega 3-6-9 Fatty Acids (OMEGA 3-6-9 COMPLEX PO) Take 1 capsule by mouth daily.    . potassium chloride SA (K-DUR,KLOR-CON) 20 MEQ tablet Take 20 mEq by mouth daily. X 3 days, then awaiting further instructions based off labs done on Friday (PMD)    . theophylline (THEODUR) 300 MG 12 hr tablet Take 300 mg by mouth 2 (two) times daily.    Marland Kitchen zolpidem (AMBIEN) 10 MG tablet Take 10 mg by mouth at bedtime as needed  for sleep.     No current facility-administered medications for this visit.     Past Medical History:  Diagnosis Date  . Arthritis   . Asthma   . Heart murmur    noted in 71 -no echo ever done  . Hypertension   . Hypothyroidism   . Wears glasses     Past Surgical History:  Procedure Laterality Date  . ABDOMINAL HYSTERECTOMY  2002  . CARPAL TUNNEL RELEASE  2005   both rt/lt  . COLONOSCOPY    . GREAT TOE ARTHRODESIS, INTERPHALANGEAL JOINT  2014   right  . GREAT TOE ARTHRODESIS, [place] PROCEDURE  2015   left  . PLANTAR FASCIA SURGERY  2015   left  . TOTAL SHOULDER ARTHROPLASTY  2012   right-[facility]  . TUBAL LIGATION      Social History   Social History  . Marital status: Married    Spouse name: N/A  . Number of children: N/A  . Years of education: N/A   Occupational History  . Not on file.   Social History Main Topics  . Smoking status: Never Smoker  . Smokeless tobacco: Never Used  . Alcohol use Yes     Comment: occ  . Drug use: No  . Sexual activity: Not on  file   Other Topics Concern  . Not on file   Social History Narrative  . No narrative on file     Vitals:   01/06/16 1311  BP: (!) 148/88  Pulse: 88  SpO2: 96%  Weight: 135 lb (61.2 kg)  Height: 5\' 4"  (1.626 m)    PHYSICAL EXAM General: NAD HEENT: Normal. Neck: No JVD, no thyromegaly. Lungs: Clear to auscultation bilaterally with normal respiratory effort. CV: Nondisplaced PMI. Regular rate and irregular rhythm, normal S1/S2, no S3, no murmur.  No pretibial or periankle edema.   Abdomen: Soft, nontender, no distention.  Neurologic: Alert and oriented.  Psych: Normal affect. Skin: Normal. Musculoskeletal: No gross deformities.    ECG: Most recent ECG reviewed.      ASSESSMENT AND PLAN: 1. Atrial fibrillation: CHADSVASC score of 3, thus anticoagulation indicated. Due to cost reasons and joint flares, will switch apixaban to Xarelto 20 mg daily. HR controlled without AV nodal  blocking agents, indicative of conduction system disease.  2. Hypertension: Again elevated but reportedly normal at home. No changes.  3. Hypokalemia: On supplementation.  Dispo: fu 6 months   Kate Sable, M.D., F.A.C.C.

## 2016-01-06 NOTE — Patient Instructions (Signed)
Medication Instructions:   Stop Eliquis.  Begin Xarelto 20mg  daily with evening meal.  Continue all other medications.    Labwork: none  Testing/Procedures: none  Follow-Up: Your physician wants you to follow up in: 6 months.  You will receive a reminder letter in the mail one-two months in advance.  If you don't receive a letter, please call our office to schedule the follow up appointment   Any Other Special Instructions Will Be Listed Below (If Applicable).  If you need a refill on your cardiac medications before your next appointment, please call your pharmacy.

## 2016-01-19 DIAGNOSIS — N644 Mastodynia: Secondary | ICD-10-CM | POA: Diagnosis not present

## 2016-01-19 DIAGNOSIS — N63 Unspecified lump in unspecified breast: Secondary | ICD-10-CM | POA: Diagnosis not present

## 2016-01-19 DIAGNOSIS — Z789 Other specified health status: Secondary | ICD-10-CM | POA: Diagnosis not present

## 2016-01-19 DIAGNOSIS — I4892 Unspecified atrial flutter: Secondary | ICD-10-CM | POA: Diagnosis not present

## 2016-01-19 DIAGNOSIS — I10 Essential (primary) hypertension: Secondary | ICD-10-CM | POA: Diagnosis not present

## 2016-01-19 DIAGNOSIS — Z299 Encounter for prophylactic measures, unspecified: Secondary | ICD-10-CM | POA: Diagnosis not present

## 2016-01-19 DIAGNOSIS — I4891 Unspecified atrial fibrillation: Secondary | ICD-10-CM | POA: Diagnosis not present

## 2016-01-20 DIAGNOSIS — D1800 Hemangioma unspecified site: Secondary | ICD-10-CM | POA: Diagnosis not present

## 2016-01-20 DIAGNOSIS — L570 Actinic keratosis: Secondary | ICD-10-CM | POA: Diagnosis not present

## 2016-01-20 DIAGNOSIS — D235 Other benign neoplasm of skin of trunk: Secondary | ICD-10-CM | POA: Diagnosis not present

## 2016-01-28 DIAGNOSIS — N6321 Unspecified lump in the left breast, upper outer quadrant: Secondary | ICD-10-CM | POA: Diagnosis not present

## 2016-01-28 DIAGNOSIS — N644 Mastodynia: Secondary | ICD-10-CM | POA: Diagnosis not present

## 2016-02-02 DIAGNOSIS — I10 Essential (primary) hypertension: Secondary | ICD-10-CM | POA: Diagnosis not present

## 2016-02-02 DIAGNOSIS — Z299 Encounter for prophylactic measures, unspecified: Secondary | ICD-10-CM | POA: Diagnosis not present

## 2016-02-20 DIAGNOSIS — I10 Essential (primary) hypertension: Secondary | ICD-10-CM | POA: Diagnosis not present

## 2016-02-20 DIAGNOSIS — G4700 Insomnia, unspecified: Secondary | ICD-10-CM | POA: Diagnosis not present

## 2016-02-20 DIAGNOSIS — Z6824 Body mass index (BMI) 24.0-24.9, adult: Secondary | ICD-10-CM | POA: Diagnosis not present

## 2016-02-20 DIAGNOSIS — Z713 Dietary counseling and surveillance: Secondary | ICD-10-CM | POA: Diagnosis not present

## 2016-02-20 DIAGNOSIS — Z299 Encounter for prophylactic measures, unspecified: Secondary | ICD-10-CM | POA: Diagnosis not present

## 2016-03-15 DIAGNOSIS — G4700 Insomnia, unspecified: Secondary | ICD-10-CM | POA: Diagnosis not present

## 2016-03-15 DIAGNOSIS — I4892 Unspecified atrial flutter: Secondary | ICD-10-CM | POA: Diagnosis not present

## 2016-03-15 DIAGNOSIS — E039 Hypothyroidism, unspecified: Secondary | ICD-10-CM | POA: Diagnosis not present

## 2016-03-15 DIAGNOSIS — J45909 Unspecified asthma, uncomplicated: Secondary | ICD-10-CM | POA: Diagnosis not present

## 2016-03-15 DIAGNOSIS — I4891 Unspecified atrial fibrillation: Secondary | ICD-10-CM | POA: Diagnosis not present

## 2016-03-15 DIAGNOSIS — Z299 Encounter for prophylactic measures, unspecified: Secondary | ICD-10-CM | POA: Diagnosis not present

## 2016-03-15 DIAGNOSIS — I10 Essential (primary) hypertension: Secondary | ICD-10-CM | POA: Diagnosis not present

## 2016-03-15 DIAGNOSIS — Z6824 Body mass index (BMI) 24.0-24.9, adult: Secondary | ICD-10-CM | POA: Diagnosis not present

## 2016-03-22 ENCOUNTER — Ambulatory Visit (INDEPENDENT_AMBULATORY_CARE_PROVIDER_SITE_OTHER): Payer: Medicare Other | Admitting: Otolaryngology

## 2016-03-22 DIAGNOSIS — R040 Epistaxis: Secondary | ICD-10-CM | POA: Diagnosis not present

## 2016-04-26 ENCOUNTER — Ambulatory Visit (INDEPENDENT_AMBULATORY_CARE_PROVIDER_SITE_OTHER): Payer: Medicare Other | Admitting: Otolaryngology

## 2016-04-26 DIAGNOSIS — R040 Epistaxis: Secondary | ICD-10-CM | POA: Diagnosis not present

## 2016-04-30 DIAGNOSIS — J45909 Unspecified asthma, uncomplicated: Secondary | ICD-10-CM | POA: Diagnosis not present

## 2016-04-30 DIAGNOSIS — E7800 Pure hypercholesterolemia, unspecified: Secondary | ICD-10-CM | POA: Diagnosis not present

## 2016-04-30 DIAGNOSIS — I10 Essential (primary) hypertension: Secondary | ICD-10-CM | POA: Diagnosis not present

## 2016-05-17 DIAGNOSIS — I499 Cardiac arrhythmia, unspecified: Secondary | ICD-10-CM | POA: Diagnosis not present

## 2016-05-17 DIAGNOSIS — I4892 Unspecified atrial flutter: Secondary | ICD-10-CM | POA: Diagnosis not present

## 2016-05-17 DIAGNOSIS — I4891 Unspecified atrial fibrillation: Secondary | ICD-10-CM | POA: Diagnosis not present

## 2016-05-17 DIAGNOSIS — E785 Hyperlipidemia, unspecified: Secondary | ICD-10-CM | POA: Diagnosis not present

## 2016-05-17 DIAGNOSIS — Z299 Encounter for prophylactic measures, unspecified: Secondary | ICD-10-CM | POA: Diagnosis not present

## 2016-05-17 DIAGNOSIS — Z6824 Body mass index (BMI) 24.0-24.9, adult: Secondary | ICD-10-CM | POA: Diagnosis not present

## 2016-05-17 DIAGNOSIS — I10 Essential (primary) hypertension: Secondary | ICD-10-CM | POA: Diagnosis not present

## 2016-05-17 DIAGNOSIS — J45909 Unspecified asthma, uncomplicated: Secondary | ICD-10-CM | POA: Diagnosis not present

## 2016-05-17 DIAGNOSIS — E039 Hypothyroidism, unspecified: Secondary | ICD-10-CM | POA: Diagnosis not present

## 2016-06-28 DIAGNOSIS — I10 Essential (primary) hypertension: Secondary | ICD-10-CM | POA: Diagnosis not present

## 2016-06-28 DIAGNOSIS — I4892 Unspecified atrial flutter: Secondary | ICD-10-CM | POA: Diagnosis not present

## 2016-06-28 DIAGNOSIS — Z713 Dietary counseling and surveillance: Secondary | ICD-10-CM | POA: Diagnosis not present

## 2016-06-28 DIAGNOSIS — I4891 Unspecified atrial fibrillation: Secondary | ICD-10-CM | POA: Diagnosis not present

## 2016-06-28 DIAGNOSIS — E039 Hypothyroidism, unspecified: Secondary | ICD-10-CM | POA: Diagnosis not present

## 2016-06-28 DIAGNOSIS — J45909 Unspecified asthma, uncomplicated: Secondary | ICD-10-CM | POA: Diagnosis not present

## 2016-06-28 DIAGNOSIS — Z96611 Presence of right artificial shoulder joint: Secondary | ICD-10-CM | POA: Diagnosis not present

## 2016-06-28 DIAGNOSIS — Z299 Encounter for prophylactic measures, unspecified: Secondary | ICD-10-CM | POA: Diagnosis not present

## 2016-06-28 DIAGNOSIS — Z6824 Body mass index (BMI) 24.0-24.9, adult: Secondary | ICD-10-CM | POA: Diagnosis not present

## 2016-07-01 DIAGNOSIS — E7800 Pure hypercholesterolemia, unspecified: Secondary | ICD-10-CM | POA: Diagnosis not present

## 2016-07-01 DIAGNOSIS — I10 Essential (primary) hypertension: Secondary | ICD-10-CM | POA: Diagnosis not present

## 2016-07-01 DIAGNOSIS — J45909 Unspecified asthma, uncomplicated: Secondary | ICD-10-CM | POA: Diagnosis not present

## 2016-07-15 DIAGNOSIS — J45909 Unspecified asthma, uncomplicated: Secondary | ICD-10-CM | POA: Diagnosis not present

## 2016-07-15 DIAGNOSIS — I10 Essential (primary) hypertension: Secondary | ICD-10-CM | POA: Diagnosis not present

## 2016-07-15 DIAGNOSIS — E7800 Pure hypercholesterolemia, unspecified: Secondary | ICD-10-CM | POA: Diagnosis not present

## 2016-08-10 ENCOUNTER — Other Ambulatory Visit: Payer: Self-pay | Admitting: Cardiovascular Disease

## 2016-08-10 MED ORDER — RIVAROXABAN 20 MG PO TABS: 20.0000 mg | ORAL_TABLET | Freq: Every day | ORAL | 0 refills | Status: DC

## 2016-08-10 NOTE — Telephone Encounter (Signed)
° ° ° °  1. Which medications need to be refilled? (please list name of each medication and dose if known Xarelto20  Mg   2. Which pharmacy/location (including street and city if local pharmacy) is medication to be sent to? [place]   3. Do they need a 30 day or 90 day supply?

## 2016-08-10 NOTE — Telephone Encounter (Signed)
Medication sent to pharmacy  

## 2016-08-17 DIAGNOSIS — M25579 Pain in unspecified ankle and joints of unspecified foot: Secondary | ICD-10-CM | POA: Diagnosis not present

## 2016-08-17 DIAGNOSIS — M722 Plantar fascial fibromatosis: Secondary | ICD-10-CM | POA: Diagnosis not present

## 2016-08-17 DIAGNOSIS — M79671 Pain in right foot: Secondary | ICD-10-CM | POA: Diagnosis not present

## 2016-08-18 DIAGNOSIS — E7800 Pure hypercholesterolemia, unspecified: Secondary | ICD-10-CM | POA: Diagnosis not present

## 2016-08-18 DIAGNOSIS — I10 Essential (primary) hypertension: Secondary | ICD-10-CM | POA: Diagnosis not present

## 2016-08-18 DIAGNOSIS — J45909 Unspecified asthma, uncomplicated: Secondary | ICD-10-CM | POA: Diagnosis not present

## 2016-09-01 ENCOUNTER — Encounter: Payer: Self-pay | Admitting: Cardiovascular Disease

## 2016-09-01 ENCOUNTER — Ambulatory Visit (INDEPENDENT_AMBULATORY_CARE_PROVIDER_SITE_OTHER): Payer: Medicare Other | Admitting: Cardiovascular Disease

## 2016-09-01 VITALS — BP 104/70 | HR 53 | Ht 65.0 in | Wt 145.0 lb

## 2016-09-01 DIAGNOSIS — I10 Essential (primary) hypertension: Secondary | ICD-10-CM | POA: Diagnosis not present

## 2016-09-01 DIAGNOSIS — I482 Chronic atrial fibrillation, unspecified: Secondary | ICD-10-CM

## 2016-09-01 DIAGNOSIS — E785 Hyperlipidemia, unspecified: Secondary | ICD-10-CM

## 2016-09-01 MED ORDER — RIVAROXABAN 20 MG PO TABS: 20.0000 mg | ORAL_TABLET | Freq: Every day | ORAL | 0 refills | Status: DC

## 2016-09-01 NOTE — Addendum Note (Signed)
Addended by: Acquanetta Chain on: 09/01/2016 09:27 AM   Modules accepted: Orders

## 2016-09-01 NOTE — Patient Instructions (Signed)

## 2016-09-01 NOTE — Addendum Note (Signed)
Addended by: Acquanetta Chain on: 09/01/2016 09:34 AM   Modules accepted: Orders

## 2016-09-01 NOTE — Progress Notes (Signed)
SUBJECTIVE: The patient presents for follow-up of atrial fibrillation. Apparently her heart rate had gotten as high as 112 bpm and her PCP started Toprol-XL.  She struggled with at least 5 nosebleeds last February and had cauterization performed. Since that time there has been no epistaxis.  She denies chest pain and shortness of breath. She denies leg swelling. She has occasional palpitations at night.   Review of Systems: As per "subjective", otherwise negative.  Allergies  Allergen Reactions  . Zestril [Lisinopril]     Lips swelled  . Zocor [Simvastatin]     Leg cramps    Current Outpatient Prescriptions  Medication Sig Dispense Refill  . amLODipine (NORVASC) 5 MG tablet Take 5 mg by mouth daily.    Marland Kitchen amoxicillin (AMOXIL) 500 MG capsule Take 500 mg by mouth as directed. Take 4 before any surgery-shoulder replacement    . atorvastatin (LIPITOR) 10 MG tablet Take 10 mg by mouth daily.    . B Complex-C (SUPER B COMPLEX PO) Take 1 tablet by mouth daily.    . calcium carbonate 1250 MG capsule Take 1,250 mg by mouth 2 (two) times daily with a meal.    . chlorthalidone (HYGROTON) 25 MG tablet Take 25 mg by mouth daily.    Marland Kitchen CRANBERRY CONCENTRATE PO Take by mouth daily.    Marland Kitchen levothyroxine (SYNTHROID, LEVOTHROID) 25 MCG tablet Take 25 mcg by mouth daily before breakfast.    . metoprolol succinate (TOPROL-XL) 25 MG 24 hr tablet Take 25 mg by mouth daily.    . Multiple Vitamins-Minerals (MULTIVITAMIN WITH MINERALS) tablet Take 1 tablet by mouth daily.    . Omega 3-6-9 Fatty Acids (OMEGA 3-6-9 COMPLEX PO) Take 1 capsule by mouth daily.    . rivaroxaban (XARELTO) 20 MG TABS tablet Take 1 tablet (20 mg total) by mouth daily with supper. 90 tablet 0   No current facility-administered medications for this visit.     Past Medical History:  Diagnosis Date  . Arthritis   . Asthma   . Heart murmur    noted in 71 -no echo ever done  . Hypertension   . Hypothyroidism   . Wears  glasses     Past Surgical History:  Procedure Laterality Date  . ABDOMINAL HYSTERECTOMY  2002  . CARPAL TUNNEL RELEASE  2005   both rt/lt  . COLONOSCOPY    . GREAT TOE ARTHRODESIS, INTERPHALANGEAL JOINT  2014   right  . GREAT TOE ARTHRODESIS, [place] PROCEDURE  2015   left  . PLANTAR FASCIA SURGERY  2015   left  . TOTAL SHOULDER ARTHROPLASTY  2012   right-[facility]  . TUBAL LIGATION      Social History   Social History  . Marital status: Married    Spouse name: N/A  . Number of children: N/A  . Years of education: N/A   Occupational History  . Not on file.   Social History Main Topics  . Smoking status: Never Smoker  . Smokeless tobacco: Never Used  . Alcohol use Yes     Comment: occ  . Drug use: No  . Sexual activity: Not on file   Other Topics Concern  . Not on file   Social History Narrative  . No narrative on file     Vitals:   09/01/16 0904  BP: 104/70  Pulse: (!) 53  SpO2: 98%  Weight: 145 lb (65.8 kg)  Height: 5\' 5"  (1.651 m)    Wt Readings from  Last 3 Encounters:  09/01/16 145 lb (65.8 kg)  01/06/16 135 lb (61.2 kg)  12/01/15 134 lb 6.4 oz (61 kg)     PHYSICAL EXAM General: NAD HEENT: Normal. Neck: No JVD, no thyromegaly. Lungs: Clear to auscultation bilaterally with normal respiratory effort. CV: Regular rate and irregularrhythm, normal S1/S2, no S3, no murmur. No pretibial or periankle edema.  No carotid bruit.   Abdomen: Soft, nontender, no distention.  Neurologic: Alert and oriented.  Psych: Normal affect. Skin: Normal. Musculoskeletal: No gross deformities.    ECG: Most recent ECG reviewed.   Labs: Lab Results  Component Value Date/Time   K 2.6 (LL) 04/14/2015 05:16 AM   BUN 22 (H) 04/14/2015 05:16 AM   CREATININE 1.08 (H) 04/14/2015 05:16 AM   HGB 12.6 04/14/2015 05:16 AM     Lipids: No results found for: LDLCALC, LDLDIRECT, CHOL, TRIG, HDL     ASSESSMENT AND PLAN:  1. Chronic atrial fibrillation: CHADSVASC  score of 3, thus anticoagulation is indicated which I again explained to her, as she had questions about potentially coming off of it. Her husband had a stroke.Continue Xarelto 20 mg daily. Now on Toprol-XL 25 mg daily with good HR control.  2. Hypertension: Controlled. No changes.  3. Hyperlipidemia: Continue Lipitor.     Disposition: Follow up 1 yr.   Kate Sable, M.D., F.A.C.C.

## 2016-09-14 DIAGNOSIS — M79672 Pain in left foot: Secondary | ICD-10-CM | POA: Diagnosis not present

## 2016-09-14 DIAGNOSIS — M25579 Pain in unspecified ankle and joints of unspecified foot: Secondary | ICD-10-CM | POA: Diagnosis not present

## 2016-10-12 DIAGNOSIS — Z23 Encounter for immunization: Secondary | ICD-10-CM | POA: Diagnosis not present

## 2016-10-26 DIAGNOSIS — M25579 Pain in unspecified ankle and joints of unspecified foot: Secondary | ICD-10-CM | POA: Diagnosis not present

## 2016-10-26 DIAGNOSIS — M79671 Pain in right foot: Secondary | ICD-10-CM | POA: Diagnosis not present

## 2016-11-22 DIAGNOSIS — H26493 Other secondary cataract, bilateral: Secondary | ICD-10-CM | POA: Diagnosis not present

## 2016-11-29 DIAGNOSIS — R5383 Other fatigue: Secondary | ICD-10-CM | POA: Diagnosis not present

## 2016-11-29 DIAGNOSIS — Z1331 Encounter for screening for depression: Secondary | ICD-10-CM | POA: Diagnosis not present

## 2016-11-29 DIAGNOSIS — E7800 Pure hypercholesterolemia, unspecified: Secondary | ICD-10-CM | POA: Diagnosis not present

## 2016-11-29 DIAGNOSIS — Z6825 Body mass index (BMI) 25.0-25.9, adult: Secondary | ICD-10-CM | POA: Diagnosis not present

## 2016-11-29 DIAGNOSIS — Z0000 Encounter for general adult medical examination without abnormal findings: Secondary | ICD-10-CM | POA: Diagnosis not present

## 2016-11-29 DIAGNOSIS — Z1339 Encounter for screening examination for other mental health and behavioral disorders: Secondary | ICD-10-CM | POA: Diagnosis not present

## 2016-11-29 DIAGNOSIS — Z7189 Other specified counseling: Secondary | ICD-10-CM | POA: Diagnosis not present

## 2016-11-29 DIAGNOSIS — Z1211 Encounter for screening for malignant neoplasm of colon: Secondary | ICD-10-CM | POA: Diagnosis not present

## 2016-11-29 DIAGNOSIS — R05 Cough: Secondary | ICD-10-CM | POA: Diagnosis not present

## 2016-11-29 DIAGNOSIS — E039 Hypothyroidism, unspecified: Secondary | ICD-10-CM | POA: Diagnosis not present

## 2016-11-29 DIAGNOSIS — Z79899 Other long term (current) drug therapy: Secondary | ICD-10-CM | POA: Diagnosis not present

## 2016-11-29 DIAGNOSIS — Z299 Encounter for prophylactic measures, unspecified: Secondary | ICD-10-CM | POA: Diagnosis not present

## 2016-12-06 DIAGNOSIS — I10 Essential (primary) hypertension: Secondary | ICD-10-CM | POA: Diagnosis not present

## 2016-12-06 DIAGNOSIS — E7800 Pure hypercholesterolemia, unspecified: Secondary | ICD-10-CM | POA: Diagnosis not present

## 2016-12-06 DIAGNOSIS — J45909 Unspecified asthma, uncomplicated: Secondary | ICD-10-CM | POA: Diagnosis not present

## 2016-12-07 DIAGNOSIS — J45909 Unspecified asthma, uncomplicated: Secondary | ICD-10-CM | POA: Diagnosis not present

## 2016-12-07 DIAGNOSIS — E2839 Other primary ovarian failure: Secondary | ICD-10-CM | POA: Diagnosis not present

## 2016-12-07 DIAGNOSIS — Z789 Other specified health status: Secondary | ICD-10-CM | POA: Diagnosis not present

## 2016-12-07 DIAGNOSIS — Z299 Encounter for prophylactic measures, unspecified: Secondary | ICD-10-CM | POA: Diagnosis not present

## 2016-12-07 DIAGNOSIS — Z713 Dietary counseling and surveillance: Secondary | ICD-10-CM | POA: Diagnosis not present

## 2016-12-07 DIAGNOSIS — Z6825 Body mass index (BMI) 25.0-25.9, adult: Secondary | ICD-10-CM | POA: Diagnosis not present

## 2016-12-10 DIAGNOSIS — J40 Bronchitis, not specified as acute or chronic: Secondary | ICD-10-CM | POA: Diagnosis not present

## 2016-12-10 DIAGNOSIS — R05 Cough: Secondary | ICD-10-CM | POA: Diagnosis not present

## 2016-12-10 DIAGNOSIS — R0989 Other specified symptoms and signs involving the circulatory and respiratory systems: Secondary | ICD-10-CM | POA: Diagnosis not present

## 2016-12-10 DIAGNOSIS — I517 Cardiomegaly: Secondary | ICD-10-CM | POA: Diagnosis not present

## 2016-12-10 DIAGNOSIS — E785 Hyperlipidemia, unspecified: Secondary | ICD-10-CM | POA: Diagnosis not present

## 2016-12-10 DIAGNOSIS — Z299 Encounter for prophylactic measures, unspecified: Secondary | ICD-10-CM | POA: Diagnosis not present

## 2016-12-10 DIAGNOSIS — R0602 Shortness of breath: Secondary | ICD-10-CM | POA: Diagnosis not present

## 2016-12-10 DIAGNOSIS — Z6825 Body mass index (BMI) 25.0-25.9, adult: Secondary | ICD-10-CM | POA: Diagnosis not present

## 2016-12-10 DIAGNOSIS — Z789 Other specified health status: Secondary | ICD-10-CM | POA: Diagnosis not present

## 2016-12-10 DIAGNOSIS — I10 Essential (primary) hypertension: Secondary | ICD-10-CM | POA: Diagnosis not present

## 2016-12-16 DIAGNOSIS — E039 Hypothyroidism, unspecified: Secondary | ICD-10-CM | POA: Diagnosis not present

## 2016-12-16 DIAGNOSIS — I4891 Unspecified atrial fibrillation: Secondary | ICD-10-CM | POA: Diagnosis not present

## 2016-12-16 DIAGNOSIS — J45909 Unspecified asthma, uncomplicated: Secondary | ICD-10-CM | POA: Diagnosis not present

## 2016-12-16 DIAGNOSIS — E785 Hyperlipidemia, unspecified: Secondary | ICD-10-CM | POA: Diagnosis not present

## 2016-12-16 DIAGNOSIS — Z6825 Body mass index (BMI) 25.0-25.9, adult: Secondary | ICD-10-CM | POA: Diagnosis not present

## 2016-12-16 DIAGNOSIS — Z789 Other specified health status: Secondary | ICD-10-CM | POA: Diagnosis not present

## 2016-12-16 DIAGNOSIS — I10 Essential (primary) hypertension: Secondary | ICD-10-CM | POA: Diagnosis not present

## 2016-12-16 DIAGNOSIS — I4892 Unspecified atrial flutter: Secondary | ICD-10-CM | POA: Diagnosis not present

## 2016-12-16 DIAGNOSIS — Z299 Encounter for prophylactic measures, unspecified: Secondary | ICD-10-CM | POA: Diagnosis not present

## 2016-12-22 DIAGNOSIS — Z6825 Body mass index (BMI) 25.0-25.9, adult: Secondary | ICD-10-CM | POA: Diagnosis not present

## 2016-12-22 DIAGNOSIS — E785 Hyperlipidemia, unspecified: Secondary | ICD-10-CM | POA: Diagnosis not present

## 2016-12-22 DIAGNOSIS — E039 Hypothyroidism, unspecified: Secondary | ICD-10-CM | POA: Diagnosis not present

## 2016-12-22 DIAGNOSIS — I499 Cardiac arrhythmia, unspecified: Secondary | ICD-10-CM | POA: Diagnosis not present

## 2016-12-22 DIAGNOSIS — J45909 Unspecified asthma, uncomplicated: Secondary | ICD-10-CM | POA: Diagnosis not present

## 2016-12-22 DIAGNOSIS — I10 Essential (primary) hypertension: Secondary | ICD-10-CM | POA: Diagnosis not present

## 2016-12-22 DIAGNOSIS — I4892 Unspecified atrial flutter: Secondary | ICD-10-CM | POA: Diagnosis not present

## 2016-12-22 DIAGNOSIS — Z299 Encounter for prophylactic measures, unspecified: Secondary | ICD-10-CM | POA: Diagnosis not present

## 2016-12-22 DIAGNOSIS — I4891 Unspecified atrial fibrillation: Secondary | ICD-10-CM | POA: Diagnosis not present

## 2017-01-07 ENCOUNTER — Other Ambulatory Visit: Payer: Self-pay | Admitting: Cardiovascular Disease

## 2017-01-19 DIAGNOSIS — L570 Actinic keratosis: Secondary | ICD-10-CM | POA: Diagnosis not present

## 2017-01-19 DIAGNOSIS — D1800 Hemangioma unspecified site: Secondary | ICD-10-CM | POA: Diagnosis not present

## 2017-01-27 DIAGNOSIS — I4891 Unspecified atrial fibrillation: Secondary | ICD-10-CM | POA: Diagnosis not present

## 2017-01-27 DIAGNOSIS — Z299 Encounter for prophylactic measures, unspecified: Secondary | ICD-10-CM | POA: Diagnosis not present

## 2017-01-27 DIAGNOSIS — J45909 Unspecified asthma, uncomplicated: Secondary | ICD-10-CM | POA: Diagnosis not present

## 2017-01-27 DIAGNOSIS — I4892 Unspecified atrial flutter: Secondary | ICD-10-CM | POA: Diagnosis not present

## 2017-01-27 DIAGNOSIS — I10 Essential (primary) hypertension: Secondary | ICD-10-CM | POA: Diagnosis not present

## 2017-01-27 DIAGNOSIS — Z789 Other specified health status: Secondary | ICD-10-CM | POA: Diagnosis not present

## 2017-02-03 ENCOUNTER — Encounter: Payer: Self-pay | Admitting: Internal Medicine

## 2017-02-03 ENCOUNTER — Ambulatory Visit (INDEPENDENT_AMBULATORY_CARE_PROVIDER_SITE_OTHER): Payer: Medicare Other | Admitting: Internal Medicine

## 2017-02-03 ENCOUNTER — Other Ambulatory Visit: Payer: Self-pay | Admitting: Internal Medicine

## 2017-02-03 ENCOUNTER — Other Ambulatory Visit (INDEPENDENT_AMBULATORY_CARE_PROVIDER_SITE_OTHER): Payer: Medicare Other

## 2017-02-03 VITALS — BP 132/82 | HR 56 | Ht 64.5 in | Wt 148.0 lb

## 2017-02-03 DIAGNOSIS — R059 Cough, unspecified: Secondary | ICD-10-CM

## 2017-02-03 DIAGNOSIS — J45991 Cough variant asthma: Secondary | ICD-10-CM | POA: Diagnosis not present

## 2017-02-03 DIAGNOSIS — R05 Cough: Secondary | ICD-10-CM

## 2017-02-03 LAB — CBC WITH DIFFERENTIAL/PLATELET
BASOS ABS: 0.1 10*3/uL (ref 0.0–0.1)
Basophils Relative: 0.6 % (ref 0.0–3.0)
EOS ABS: 0.1 10*3/uL (ref 0.0–0.7)
Eosinophils Relative: 1.2 % (ref 0.0–5.0)
HCT: 46.2 % — ABNORMAL HIGH (ref 36.0–46.0)
Hemoglobin: 15.7 g/dL — ABNORMAL HIGH (ref 12.0–15.0)
LYMPHS ABS: 2.7 10*3/uL (ref 0.7–4.0)
LYMPHS PCT: 34.9 % (ref 12.0–46.0)
MCHC: 34.1 g/dL (ref 30.0–36.0)
MCV: 93.4 fl (ref 78.0–100.0)
MONOS PCT: 8 % (ref 3.0–12.0)
Monocytes Absolute: 0.6 10*3/uL (ref 0.1–1.0)
NEUTROS ABS: 4.3 10*3/uL (ref 1.4–7.7)
NEUTROS PCT: 55.3 % (ref 43.0–77.0)
PLATELETS: 267 10*3/uL (ref 150.0–400.0)
RBC: 4.95 Mil/uL (ref 3.87–5.11)
RDW: 14.8 % (ref 11.5–15.5)
WBC: 7.9 10*3/uL (ref 4.0–10.5)

## 2017-02-03 LAB — NITRIC OXIDE: Nitric Oxide: 14

## 2017-02-03 MED ORDER — BUDESONIDE-FORMOTEROL FUMARATE 80-4.5 MCG/ACT IN AERO: 2.0000 | INHALATION_SPRAY | Freq: Two times a day (BID) | RESPIRATORY_TRACT | 0 refills | Status: DC

## 2017-02-03 MED ORDER — BUDESONIDE-FORMOTEROL FUMARATE 80-4.5 MCG/ACT IN AERO: 2.0000 | INHALATION_SPRAY | Freq: Two times a day (BID) | RESPIRATORY_TRACT | 11 refills | Status: DC

## 2017-02-03 NOTE — Patient Instructions (Addendum)
Plan A = Automatic = symbiocort 80 2bid  After one week try to reduce your theophylline by half   Plan B = Backup Only use your albuterol as a rescue medication to be used if you can't catch your breath by resting or doing a relaxed purse lip breathing pattern.  - The less you use it, the better it will work when you need it. - Ok to use the inhaler up to 2 puffs  every 4 hours if you must but call for appointment if use goes up over your usual need - Don't leave home without it !!  (think of it like the spare tire for your car)   Plan C = Crisis - only use your albuterol nebulizer if you first try Plan B and it fails to help > ok to use the nebulizer up to every 4 hours but if start needing it regularly call for immediate appointment   GERD (REFLUX)  is an extremely common cause of respiratory symptoms just like yours , many times with no obvious heartburn at all.    It can be treated with medication, but also with lifestyle changes including elevation of the head of your bed (ideally with 6 inch  bed blocks),  Smoking cessation, avoidance of late meals, excessive alcohol, and avoid fatty foods, chocolate, peppermint, colas, red wine, and acidic juices such as orange juice.  NO MINT OR MENTHOL PRODUCTS SO NO COUGH DROPS   USE SUGARLESS CANDY INSTEAD (Jolley ranchers or Stover's or Life Savers) or even ice chips will also do - the key is to swallow to prevent all throat clearing. NO OIL BASED VITAMINS - use powdered substitutes.    Please see patient coordinator before you leave today  to schedule sinus CT  Please remember to go to the lab department downstairs in the basement  for your tests - we will call you with the results when they are available.  Please schedule a follow up office visit in 4 weeks, sooner if needed

## 2017-02-03 NOTE — Progress Notes (Signed)
Subjective:     Patient ID: Melissa Faulkner, female   DOB: 12/10/1945,     MRN: [id number]  HPI   75 yowf never smoker  with asthma all her life = several times a year would have bad wheeze/ cough / sob miss school up to 40 days but in between fine s need for any maint rx but very inactive, never did sports and gradually worse to point where went to [facility] asthma specialist >  "allergic to a lot of things"  Including cats/pollen/ grass/ dogs rx shots x 3 years and maint theoph/inhalers as x years but eventually stopped inhalers/ continued theoph  then coughing her in 42s while ACEi and never better since so weaned off theoph summer 2018 and then much worse November 2018 assoc with wheezing/ sob  rx BREO no better and started nebs at home no better and 3 rounds of prednisone and back baseline = coughing daily > noct min productive clear so referred to pulmonary clinic 02/03/2017 by Dr   Woody Seller.  h/o AF (developed on theoph) 2017 per pt   02/03/2017 1st [facility] office visit/ Izick Gasbarro   Chief Complaint  Patient presents with  . Pulmonary Consult    Referred by Dr. Woody Seller. She states she has had asthma all of her life and it was been under control until Nov 2018. She states she was taken off of Theophylline and this is why her symptoms got worse. She was put back on Theophylline and this has helped. She is still couging some and has minimal wheezing.   nose feels clogged up x 1 year/ worse since Nov 2018 Sleeps flat ok  Doe = MMRC1 = can walk nl pace, flat grade, can't hurry or go uphills or steps s sob  Using also saba in multiple forms - help some with cough and sob    No obvious day to day or daytime variability or assoc excess/ purulent sputum or mucus plugs or hemoptysis or cp or chest tightness, subjective wheeze or overt   hb symptoms. No unusual exposure hx or h/o childhood pna/ asthma or knowledge of premature birth.  Sleeping ok flat without nocturnal  or early am exacerbation  of  respiratory  c/o's or need for noct saba. Also denies any obvious fluctuation of symptoms with weather or environmental changes or other aggravating or alleviating factors except as outlined above   Current Allergies, Complete Past Medical History, Past Surgical History, Family History, and Social History were reviewed in [employer] record.  ROS  The following are not active complaints unless bolded Hoarseness, sore throat, dysphagia, dental problems, itching, sneezing,  nasal congestion or discharge of excess mucus or purulent secretions, ear ache,   fever, chills, sweats, unintended wt loss or wt gain, classically pleuritic or exertional cp,  orthopnea pnd or leg swelling, presyncope, palpitations, abdominal pain, anorexia, nausea, vomiting, diarrhea  or change in bowel habits or change in bladder habits, change in stools or change in urine, dysuria, hematuria,  rash, arthralgias, visual complaints, headache, numbness, weakness or ataxia or problems with walking or coordination,  change in mood/affect or memory.         Current Meds  Medication Sig  . amLODipine (NORVASC) 5 MG tablet Take 5 mg by mouth daily.  Marland Kitchen amoxicillin-clavulanate (AUGMENTIN) 875-125 MG tablet Take 1 tablet by mouth 2 (two) times daily.  Marland Kitchen atorvastatin (LIPITOR) 10 MG tablet Take 10 mg by mouth daily.  . calcium carbonate (CALCIUM 600) 600  MG TABS tablet Take 600 mg by mouth 2 (two) times daily with a meal.  . chlorthalidone (HYGROTON) 25 MG tablet Take 25 mg by mouth daily.  Marland Kitchen CRANBERRY CONCENTRATE PO Take by mouth daily.  Marland Kitchen CRANBERRY PO Take 1 capsule by mouth daily.  Marland Kitchen levothyroxine (SYNTHROID, LEVOTHROID) 25 MCG tablet Take 25 mcg by mouth daily before breakfast.  . metoprolol succinate (TOPROL-XL) 25 MG 24 hr tablet Take 25 mg by mouth daily.  . Multiple Vitamins-Minerals (MULTIVITAMIN WITH MINERALS) tablet Take 1 tablet by mouth daily.  . Omega 3-6-9 Fatty Acids (OMEGA 3-6-9 COMPLEX PO) Take  1 capsule by mouth daily.  . theophylline (UNIPHYL) 600 MG 24 hr tablet Take 300 mg by mouth daily.  . VENTOLIN HFA 108 (90 Base) MCG/ACT inhaler Inhale 2 puffs into the lungs every 4 (four) hours as needed.  Alveda Reasons 20 MG TABS tablet TAKE (1) TABLET DAILY WITH SUPPER.            Review of Systems     Objective:   Physical Exam    stoic somber wf nad with very congested sounding rattling cough    Wt Readings from Last 3 Encounters:  02/03/17 148 lb (67.1 kg)  09/01/16 145 lb (65.8 kg)  01/06/16 135 lb (61.2 kg)     Vital signs reviewed - Note on arrival 02 sats  95% on RA   HEENT: nl dentition, and oropharynx. Nl external ear canals without cough reflex - mild bilateral non-specific turbinate edema     NECK :  without JVD/Nodes/TM/ nl carotid upstrokes bilaterally   LUNGS: no acc muscle use,  Nl contour chest with bilateral late exp wheeze/ cough improves with plm    CV:  IRIR   no s3 or murmur or increase in P2, and no edema   ABD:  soft and nontender with nl inspiratory excursion in the supine position. No bruits or organomegaly appreciated, bowel sounds nl  MS:  Nl gait/ ext warm without deformities, calf tenderness, cyanosis or clubbing No obvious joint restrictions   SKIN: warm and dry without lesions    NEURO:  alert, approp, nl sensorium with  no motor or cerebellar deficits apparent.       cxr 12/2015 reported wnl at [facility] > requested copy  Labs ordered 02/03/2017   Allergy profile    Assessment:

## 2017-02-04 ENCOUNTER — Encounter: Payer: Self-pay | Admitting: Internal Medicine

## 2017-02-04 NOTE — Assessment & Plan Note (Signed)
FENO 02/03/2017  =   14 - Spirometry 02/03/2017  Nl on theoph 600 mg daily  - 02/03/2017  After extensive coaching inhaler device  effectiveness =    90% symb 80 2bid Allergy profile 02/03/2017 >  Eos 0.1 /  IgE   DDX of  difficult airways management almost all start with A and  include Adherence, Ace Inhibitors, Acid Reflux, Active Sinus Disease, Alpha 1 Antitripsin deficiency, Anxiety masquerading as Airways dz,  ABPA,  Allergy(esp in young), Aspiration (esp in elderly), Adverse effects of meds,  Active smokers, A bunch of PE's (a small clot burden can't cause this syndrome unless there is already severe underlying pulm or vascular dz with poor reserve) plus two Bs  = Bronchiectasis and Beta blocker use..and one C= CHF    Adherence is always the initial "prime suspect" and is a multilayered concern that requires a "trust but verify" approach in every patient - starting with knowing how to use medications, especially inhalers, correctly, keeping up with refills and understanding the fundamental difference between maintenance and prns vs those medications only taken for a very short course and then stopped and not refilled.  - see hfa teaching - return with all meds in hand using a trust but verify approach to confirm accurate Medication  Reconciliation The principal here is that until we are certain that the  patients are doing what we've asked, it makes no sense to ask them to do more.    ? Allergy > send profile, use just low dose ics as FENO quite low here  ? Acid reflux > diet/ taper theoph off if possible/ diet reviewed  ? Adverse drug effects > poor tol dpi reported, try hfa and wean off theoph esp sinc has AF  ? Active sinus dz > sinus ct next  ? A Bunch of PEs > unlikely on DOAC  ? Alpha one AT > nl spirometry rules out MM status @ age 72  ? BB effects > unlikely on such low doses of lopressor  ? chf > echo 12/2015: Left ventricle: The cavity size was normal. Wall thickness was  normal. Systolic function was normal. The estimated ejection   fraction was in the range of 60% to 65%. Wall motion was normal;   there were no regional wall motion abnormalities. The study is   not technically sufficient to allow evaluation of LV diastolic   function. - Mitral valve: Mildly calcified annulus. Mildly thickened leaflets   . There was mild regurgitation. - Left atrium: The atrium was moderately dilated. ? Could have cardiac asthma, low threshold to check bnp next flare though presently sleeps fine flat and no edema in lower ext's   Total time devoted to counseling  > 50 % of initial 60 min office visit:  review case with pt/ discussion of options/alternatives/ personally creating written customized instructions  in presence of pt  then going over those specific  Instructions directly with the pt including how to use all of the meds but in particular covering each new medication in detail and the difference between the maintenance= "automatic" meds and the prns using an action plan format for the latter (If this problem/symptom => do that organization reading Left to right).  Please see AVS from this visit for a full list of these instructions which I personally wrote for this pt and  are unique to this visit.

## 2017-02-07 LAB — RESPIRATORY ALLERGY PROFILE REGION II ~~LOC~~
ALLERGEN, D PTERNOYSSINUS, D1: 4.44 kU/L — AB
Allergen, Cedar tree, t12: <0.1 kU/L
Allergen, Cottonwood, t14: <0.1 kU/L
Allergen, Mouse Urine Protein, e78: <0.1 kU/L
Allergen, Mulberry, t76: <0.1 kU/L
Bermuda Grass: 0.24 kU/L — ABNORMAL HIGH
CLADOSPORIUM HERBARUM (M2) IGE: <0.1 kU/L
CLASS: 0
CLASS: 0
CLASS: 0
CLASS: 0
CLASS: 0
CLASS: 0
CLASS: 0
CLASS: 0
CLASS: 0
CLASS: 0
CLASS: 0
CLASS: 0
CLASS: 0
CLASS: 2
CLASS: 3
Cat Dander: 0.88 kU/L — ABNORMAL HIGH
Class: 0
Class: 0
Class: 0
Class: 0
Class: 0
Class: 0
Class: 0
Class: 1
Class: 3
D. FARINAE: 6.15 kU/L — AB
Dog Dander: 0.62 kU/L — ABNORMAL HIGH
IGE (IMMUNOGLOBULIN E), SERUM: 65 kU/L (ref <=114)
JOHNSON GRASS: 0.32 kU/L — AB
Rough Pigweed  IgE: <0.1 kU/L
TIMOTHY GRASS: 0.31 kU/L — AB

## 2017-02-07 LAB — INTERPRETATION:

## 2017-02-08 NOTE — Progress Notes (Signed)
Spoke with pt and notified of results per Dr. Wert. Pt verbalized understanding and denied any questions. 

## 2017-02-09 ENCOUNTER — Ambulatory Visit (HOSPITAL_COMMUNITY): Payer: Medicare Other

## 2017-02-14 ENCOUNTER — Ambulatory Visit (HOSPITAL_COMMUNITY)
Admission: RE | Admit: 2017-02-14 | Discharge: 2017-02-14 | Disposition: A | Discharge status: Home / Self Care | Payer: Medicare Other | Source: Ambulatory Visit | Attending: Internal Medicine | Admitting: Internal Medicine

## 2017-02-14 DIAGNOSIS — J320 Chronic maxillary sinusitis: Secondary | ICD-10-CM | POA: Diagnosis not present

## 2017-02-14 DIAGNOSIS — J45991 Cough variant asthma: Secondary | ICD-10-CM | POA: Diagnosis not present

## 2017-02-14 DIAGNOSIS — R05 Cough: Secondary | ICD-10-CM | POA: Insufficient documentation

## 2017-02-14 DIAGNOSIS — R059 Cough, unspecified: Secondary | ICD-10-CM

## 2017-02-15 NOTE — Progress Notes (Signed)
Spoke with pt and notified of results per Dr. Wert. Pt verbalized understanding and denied any questions. 

## 2017-02-28 DIAGNOSIS — M79672 Pain in left foot: Secondary | ICD-10-CM | POA: Diagnosis not present

## 2017-02-28 DIAGNOSIS — M25579 Pain in unspecified ankle and joints of unspecified foot: Secondary | ICD-10-CM | POA: Diagnosis not present

## 2017-02-28 DIAGNOSIS — M79671 Pain in right foot: Secondary | ICD-10-CM | POA: Diagnosis not present

## 2017-03-01 DIAGNOSIS — Z6826 Body mass index (BMI) 26.0-26.9, adult: Secondary | ICD-10-CM | POA: Diagnosis not present

## 2017-03-01 DIAGNOSIS — I4892 Unspecified atrial flutter: Secondary | ICD-10-CM | POA: Diagnosis not present

## 2017-03-01 DIAGNOSIS — I10 Essential (primary) hypertension: Secondary | ICD-10-CM | POA: Diagnosis not present

## 2017-03-01 DIAGNOSIS — I4891 Unspecified atrial fibrillation: Secondary | ICD-10-CM | POA: Diagnosis not present

## 2017-03-01 DIAGNOSIS — Z299 Encounter for prophylactic measures, unspecified: Secondary | ICD-10-CM | POA: Diagnosis not present

## 2017-03-03 ENCOUNTER — Ambulatory Visit: Payer: Medicare Other | Admitting: Internal Medicine

## 2017-03-23 ENCOUNTER — Encounter: Payer: Self-pay | Admitting: Cardiovascular Disease

## 2017-03-23 ENCOUNTER — Other Ambulatory Visit: Payer: Self-pay

## 2017-03-23 ENCOUNTER — Ambulatory Visit (INDEPENDENT_AMBULATORY_CARE_PROVIDER_SITE_OTHER): Payer: Medicare Other | Admitting: Cardiovascular Disease

## 2017-03-23 VITALS — BP 127/79 | HR 71 | Ht 64.0 in | Wt 153.0 lb

## 2017-03-23 DIAGNOSIS — E785 Hyperlipidemia, unspecified: Secondary | ICD-10-CM | POA: Diagnosis not present

## 2017-03-23 DIAGNOSIS — I10 Essential (primary) hypertension: Secondary | ICD-10-CM

## 2017-03-23 DIAGNOSIS — I482 Chronic atrial fibrillation: Secondary | ICD-10-CM

## 2017-03-23 DIAGNOSIS — I4821 Permanent atrial fibrillation: Secondary | ICD-10-CM

## 2017-03-23 MED ORDER — RIVAROXABAN 20 MG PO TABS: ORAL_TABLET | ORAL | 1 refills | Status: DC

## 2017-03-23 MED ORDER — RIVAROXABAN 20 MG PO TABS: ORAL_TABLET | ORAL | 0 refills | Status: DC

## 2017-03-23 NOTE — Patient Instructions (Signed)

## 2017-03-23 NOTE — Progress Notes (Signed)
SUBJECTIVE: The patient presents for follow-up of atrial fibrillation.  She has a history of epistaxis for which she underwent cauterization.  She denies chest pain, shortness of breath, dizziness, bleeding problems, and leg swelling.  She has occasional palpitations.  She tries to stay active by walking.  ECG performed today which I personally reviewed demonstrates rate controlled atrial fibrillation, 63 bpm.    Review of Systems: As per "subjective", otherwise negative.  Allergies  Allergen Reactions  . Zestril [Lisinopril]     Lips swelled  . Zocor [Simvastatin]     Leg cramps    Current Outpatient Medications  Medication Sig Dispense Refill  . amLODipine (NORVASC) 5 MG tablet Take 5 mg by mouth daily.    Marland Kitchen atorvastatin (LIPITOR) 10 MG tablet Take 10 mg by mouth daily.    . calcium carbonate (CALCIUM 600) 600 MG TABS tablet Take 600 mg by mouth 2 (two) times daily with a meal.    . chlorthalidone (HYGROTON) 25 MG tablet Take 25 mg by mouth daily.    Marland Kitchen CRANBERRY PO Take 1 capsule by mouth daily.    Marland Kitchen levothyroxine (SYNTHROID, LEVOTHROID) 25 MCG tablet Take 25 mcg by mouth daily before breakfast.    . metoprolol succinate (TOPROL-XL) 25 MG 24 hr tablet Take 25 mg by mouth daily.    . Multiple Vitamins-Minerals (MULTIVITAMIN WITH MINERALS) tablet Take 1 tablet by mouth daily.    . Omega 3-6-9 Fatty Acids (OMEGA 3-6-9 COMPLEX PO) Take 1 capsule by mouth daily.    . SUPER B COMPLEX/C PO Take by mouth.    . theophylline (UNIPHYL) 600 MG 24 hr tablet Take 300 mg by mouth daily.    . VENTOLIN HFA 108 (90 Base) MCG/ACT inhaler Inhale 2 puffs into the lungs every 4 (four) hours as needed.    Alveda Reasons 20 MG TABS tablet TAKE (1) TABLET DAILY WITH SUPPER. 90 tablet 2   No current facility-administered medications for this visit.     Past Medical History:  Diagnosis Date  . Arthritis   . Asthma   . Heart murmur    noted in 71 -no echo ever done  . Hypertension   .  Hypothyroidism   . Wears glasses     Past Surgical History:  Procedure Laterality Date  . ABDOMINAL HYSTERECTOMY  2002  . CARPAL TUNNEL RELEASE  2005   both rt/lt  . COLONOSCOPY    . GREAT TOE ARTHRODESIS, INTERPHALANGEAL JOINT  2014   right  . GREAT TOE ARTHRODESIS, [place] PROCEDURE  2015   left  . PLANTAR FASCIA SURGERY  2015   left  . TOTAL SHOULDER ARTHROPLASTY  2012   right-[facility]  . TUBAL LIGATION      Social History   Socioeconomic History  . Marital status: Married    Spouse name: Not on file  . Number of children: Not on file  . Years of education: Not on file  . Highest education level: Not on file  Social Needs  . Financial resource strain: Not on file  . Food insecurity - worry: Not on file  . Food insecurity - inability: Not on file  . Transportation needs - medical: Not on file  . Transportation needs - non-medical: Not on file  Occupational History  . Not on file  Tobacco Use  . Smoking status: Never Smoker  . Smokeless tobacco: Never Used  Substance and Sexual Activity  . Alcohol use: Yes    Comment: occ  .  Drug use: No  . Sexual activity: Not on file  Other Topics Concern  . Not on file  Social History Narrative  . Not on file     Vitals:   03/23/17 1115  BP: 127/79  Pulse: 71  SpO2: 98%  Weight: 153 lb (69.4 kg)  Height: 5\' 4"  (1.626 m)    Wt Readings from Last 3 Encounters:  03/23/17 153 lb (69.4 kg)  02/03/17 148 lb (67.1 kg)  09/01/16 145 lb (65.8 kg)     PHYSICAL EXAM General: NAD HEENT: Normal. Neck: No JVD, no thyromegaly. Lungs: Clear to auscultation bilaterally with normal respiratory effort. CV: Regular rate and irregularrhythm, normal S1/S2, no S3, no murmur. No pretibial or periankle edema.  No carotid bruit.   Abdomen: Soft, nontender, no distention.  Neurologic: Alert and oriented.  Psych: Normal affect. Skin: Normal. Musculoskeletal: No gross deformities.    ECG: Most recent ECG  reviewed.   Labs: Lab Results  Component Value Date/Time   K 2.6 (LL) 04/14/2015 05:16 AM   BUN 22 (H) 04/14/2015 05:16 AM   CREATININE 1.08 (H) 04/14/2015 05:16 AM   HGB 15.7 (H) 02/03/2017 02:55 PM     Lipids: No results found for: LDLCALC, LDLDIRECT, CHOL, TRIG, HDL     ASSESSMENT AND PLAN: 1.  Permanent atrial fibrillation: Symptomatically stable.  Continue metoprolol succinate 25 mg daily.  Continue Xarelto 20 mg daily for systemic anticoagulation.  2.  Chronic hypertension: Blood pressure is controlled.  No changes to therapy.  3.  Hyperlipidemia: Continue Lipitor 10 mg.    Disposition: Follow up 6 months as per her request   Kate Sable, M.D., F.A.C.C.

## 2017-03-23 NOTE — Addendum Note (Signed)
Addended by: Julian Hy T on: 03/23/2017 11:49 AM   Modules accepted: Orders

## 2017-03-28 DIAGNOSIS — M25579 Pain in unspecified ankle and joints of unspecified foot: Secondary | ICD-10-CM | POA: Diagnosis not present

## 2017-03-28 DIAGNOSIS — M79671 Pain in right foot: Secondary | ICD-10-CM | POA: Diagnosis not present

## 2017-04-22 DIAGNOSIS — I10 Essential (primary) hypertension: Secondary | ICD-10-CM | POA: Diagnosis not present

## 2017-04-22 DIAGNOSIS — E7800 Pure hypercholesterolemia, unspecified: Secondary | ICD-10-CM | POA: Diagnosis not present

## 2017-04-22 DIAGNOSIS — J45909 Unspecified asthma, uncomplicated: Secondary | ICD-10-CM | POA: Diagnosis not present

## 2017-05-31 DIAGNOSIS — Z6826 Body mass index (BMI) 26.0-26.9, adult: Secondary | ICD-10-CM | POA: Diagnosis not present

## 2017-05-31 DIAGNOSIS — I10 Essential (primary) hypertension: Secondary | ICD-10-CM | POA: Diagnosis not present

## 2017-05-31 DIAGNOSIS — E039 Hypothyroidism, unspecified: Secondary | ICD-10-CM | POA: Diagnosis not present

## 2017-05-31 DIAGNOSIS — I4891 Unspecified atrial fibrillation: Secondary | ICD-10-CM | POA: Diagnosis not present

## 2017-05-31 DIAGNOSIS — Z299 Encounter for prophylactic measures, unspecified: Secondary | ICD-10-CM | POA: Diagnosis not present

## 2017-05-31 DIAGNOSIS — I4892 Unspecified atrial flutter: Secondary | ICD-10-CM | POA: Diagnosis not present

## 2017-07-25 DIAGNOSIS — Z96611 Presence of right artificial shoulder joint: Secondary | ICD-10-CM | POA: Diagnosis not present

## 2017-07-25 DIAGNOSIS — M19011 Primary osteoarthritis, right shoulder: Secondary | ICD-10-CM | POA: Diagnosis not present

## 2017-07-25 DIAGNOSIS — Z471 Aftercare following joint replacement surgery: Secondary | ICD-10-CM | POA: Diagnosis not present

## 2017-07-25 DIAGNOSIS — Z888 Allergy status to other drugs, medicaments and biological substances status: Secondary | ICD-10-CM | POA: Diagnosis not present

## 2017-07-26 DIAGNOSIS — M79672 Pain in left foot: Secondary | ICD-10-CM | POA: Diagnosis not present

## 2017-07-26 DIAGNOSIS — M25579 Pain in unspecified ankle and joints of unspecified foot: Secondary | ICD-10-CM | POA: Diagnosis not present

## 2017-07-26 DIAGNOSIS — M79675 Pain in left toe(s): Secondary | ICD-10-CM | POA: Diagnosis not present

## 2017-07-28 DIAGNOSIS — Z6827 Body mass index (BMI) 27.0-27.9, adult: Secondary | ICD-10-CM | POA: Diagnosis not present

## 2017-07-28 DIAGNOSIS — N644 Mastodynia: Secondary | ICD-10-CM | POA: Diagnosis not present

## 2017-07-28 DIAGNOSIS — I10 Essential (primary) hypertension: Secondary | ICD-10-CM | POA: Diagnosis not present

## 2017-07-28 DIAGNOSIS — E785 Hyperlipidemia, unspecified: Secondary | ICD-10-CM | POA: Diagnosis not present

## 2017-07-28 DIAGNOSIS — Z299 Encounter for prophylactic measures, unspecified: Secondary | ICD-10-CM | POA: Diagnosis not present

## 2017-08-16 ENCOUNTER — Other Ambulatory Visit: Payer: Self-pay | Admitting: Nurse Practitioner

## 2017-08-16 DIAGNOSIS — N644 Mastodynia: Secondary | ICD-10-CM

## 2017-08-19 ENCOUNTER — Ambulatory Visit
Admission: RE | Admit: 2017-08-19 | Discharge: 2017-08-19 | Disposition: A | Discharge status: Home / Self Care | Payer: Medicare Other | Source: Ambulatory Visit | Attending: Nurse Practitioner | Admitting: Nurse Practitioner

## 2017-08-19 ENCOUNTER — Ambulatory Visit: Payer: Medicare Other

## 2017-08-19 ENCOUNTER — Other Ambulatory Visit: Payer: Self-pay | Admitting: Nurse Practitioner

## 2017-08-19 DIAGNOSIS — R928 Other abnormal and inconclusive findings on diagnostic imaging of breast: Secondary | ICD-10-CM | POA: Diagnosis not present

## 2017-08-19 DIAGNOSIS — N644 Mastodynia: Secondary | ICD-10-CM

## 2017-08-23 DIAGNOSIS — M79672 Pain in left foot: Secondary | ICD-10-CM | POA: Diagnosis not present

## 2017-08-23 DIAGNOSIS — M25579 Pain in unspecified ankle and joints of unspecified foot: Secondary | ICD-10-CM | POA: Diagnosis not present

## 2017-08-30 DIAGNOSIS — M79672 Pain in left foot: Secondary | ICD-10-CM | POA: Diagnosis not present

## 2017-08-30 DIAGNOSIS — M2022 Hallux rigidus, left foot: Secondary | ICD-10-CM | POA: Diagnosis not present

## 2017-08-30 DIAGNOSIS — M25579 Pain in unspecified ankle and joints of unspecified foot: Secondary | ICD-10-CM | POA: Diagnosis not present

## 2017-08-31 DIAGNOSIS — M79675 Pain in left toe(s): Secondary | ICD-10-CM | POA: Diagnosis not present

## 2017-08-31 DIAGNOSIS — I4891 Unspecified atrial fibrillation: Secondary | ICD-10-CM | POA: Diagnosis not present

## 2017-08-31 DIAGNOSIS — Z6827 Body mass index (BMI) 27.0-27.9, adult: Secondary | ICD-10-CM | POA: Diagnosis not present

## 2017-08-31 DIAGNOSIS — E785 Hyperlipidemia, unspecified: Secondary | ICD-10-CM | POA: Diagnosis not present

## 2017-08-31 DIAGNOSIS — I10 Essential (primary) hypertension: Secondary | ICD-10-CM | POA: Diagnosis not present

## 2017-08-31 DIAGNOSIS — Z299 Encounter for prophylactic measures, unspecified: Secondary | ICD-10-CM | POA: Diagnosis not present

## 2017-09-06 DIAGNOSIS — Z90710 Acquired absence of both cervix and uterus: Secondary | ICD-10-CM | POA: Diagnosis not present

## 2017-09-06 DIAGNOSIS — M1990 Unspecified osteoarthritis, unspecified site: Secondary | ICD-10-CM | POA: Diagnosis not present

## 2017-09-06 DIAGNOSIS — I4891 Unspecified atrial fibrillation: Secondary | ICD-10-CM | POA: Diagnosis not present

## 2017-09-06 DIAGNOSIS — I10 Essential (primary) hypertension: Secondary | ICD-10-CM | POA: Diagnosis not present

## 2017-09-06 DIAGNOSIS — J45909 Unspecified asthma, uncomplicated: Secondary | ICD-10-CM | POA: Diagnosis not present

## 2017-09-06 DIAGNOSIS — Z7901 Long term (current) use of anticoagulants: Secondary | ICD-10-CM | POA: Diagnosis not present

## 2017-09-06 DIAGNOSIS — T8484XA Pain due to internal orthopedic prosthetic devices, implants and grafts, initial encounter: Secondary | ICD-10-CM | POA: Diagnosis not present

## 2017-09-06 DIAGNOSIS — Z9851 Tubal ligation status: Secondary | ICD-10-CM | POA: Diagnosis not present

## 2017-09-06 DIAGNOSIS — E039 Hypothyroidism, unspecified: Secondary | ICD-10-CM | POA: Diagnosis not present

## 2017-09-06 DIAGNOSIS — M19072 Primary osteoarthritis, left ankle and foot: Secondary | ICD-10-CM | POA: Diagnosis not present

## 2017-09-06 DIAGNOSIS — Z79899 Other long term (current) drug therapy: Secondary | ICD-10-CM | POA: Diagnosis not present

## 2017-09-06 DIAGNOSIS — M205X2 Other deformities of toe(s) (acquired), left foot: Secondary | ICD-10-CM | POA: Diagnosis not present

## 2017-09-06 DIAGNOSIS — G8918 Other acute postprocedural pain: Secondary | ICD-10-CM | POA: Diagnosis not present

## 2017-09-06 DIAGNOSIS — E7800 Pure hypercholesterolemia, unspecified: Secondary | ICD-10-CM | POA: Diagnosis not present

## 2017-09-06 DIAGNOSIS — Z888 Allergy status to other drugs, medicaments and biological substances status: Secondary | ICD-10-CM | POA: Diagnosis not present

## 2017-09-09 DIAGNOSIS — T8484XA Pain due to internal orthopedic prosthetic devices, implants and grafts, initial encounter: Secondary | ICD-10-CM | POA: Diagnosis not present

## 2017-09-09 DIAGNOSIS — G8918 Other acute postprocedural pain: Secondary | ICD-10-CM | POA: Diagnosis not present

## 2017-09-09 DIAGNOSIS — Z888 Allergy status to other drugs, medicaments and biological substances status: Secondary | ICD-10-CM | POA: Diagnosis not present

## 2017-09-09 DIAGNOSIS — M2022 Hallux rigidus, left foot: Secondary | ICD-10-CM | POA: Diagnosis not present

## 2017-09-09 DIAGNOSIS — M79675 Pain in left toe(s): Secondary | ICD-10-CM | POA: Diagnosis not present

## 2017-09-09 DIAGNOSIS — Z7901 Long term (current) use of anticoagulants: Secondary | ICD-10-CM | POA: Diagnosis not present

## 2017-09-09 DIAGNOSIS — M205X2 Other deformities of toe(s) (acquired), left foot: Secondary | ICD-10-CM | POA: Diagnosis not present

## 2017-09-09 DIAGNOSIS — Z79899 Other long term (current) drug therapy: Secondary | ICD-10-CM | POA: Diagnosis not present

## 2017-09-09 DIAGNOSIS — M79672 Pain in left foot: Secondary | ICD-10-CM | POA: Diagnosis not present

## 2017-09-13 DIAGNOSIS — M79672 Pain in left foot: Secondary | ICD-10-CM | POA: Diagnosis not present

## 2017-09-13 DIAGNOSIS — M79675 Pain in left toe(s): Secondary | ICD-10-CM | POA: Diagnosis not present

## 2017-09-13 DIAGNOSIS — M2022 Hallux rigidus, left foot: Secondary | ICD-10-CM | POA: Diagnosis not present

## 2017-09-15 DIAGNOSIS — J45909 Unspecified asthma, uncomplicated: Secondary | ICD-10-CM | POA: Diagnosis not present

## 2017-09-15 DIAGNOSIS — E7800 Pure hypercholesterolemia, unspecified: Secondary | ICD-10-CM | POA: Diagnosis not present

## 2017-09-15 DIAGNOSIS — I10 Essential (primary) hypertension: Secondary | ICD-10-CM | POA: Diagnosis not present

## 2017-09-21 ENCOUNTER — Encounter: Payer: Self-pay | Admitting: Cardiovascular Disease

## 2017-09-21 ENCOUNTER — Other Ambulatory Visit: Payer: Self-pay | Admitting: *Deleted

## 2017-09-21 ENCOUNTER — Ambulatory Visit (INDEPENDENT_AMBULATORY_CARE_PROVIDER_SITE_OTHER): Payer: Medicare Other | Admitting: Cardiovascular Disease

## 2017-09-21 VITALS — BP 110/72 | HR 71 | Ht 65.0 in | Wt 158.0 lb

## 2017-09-21 DIAGNOSIS — I482 Chronic atrial fibrillation: Secondary | ICD-10-CM

## 2017-09-21 DIAGNOSIS — I10 Essential (primary) hypertension: Secondary | ICD-10-CM | POA: Diagnosis not present

## 2017-09-21 DIAGNOSIS — E785 Hyperlipidemia, unspecified: Secondary | ICD-10-CM

## 2017-09-21 DIAGNOSIS — I4821 Permanent atrial fibrillation: Secondary | ICD-10-CM

## 2017-09-21 DIAGNOSIS — Z7901 Long term (current) use of anticoagulants: Secondary | ICD-10-CM | POA: Diagnosis not present

## 2017-09-21 MED ORDER — RIVAROXABAN 20 MG PO TABS: ORAL_TABLET | ORAL | 0 refills | Status: DC

## 2017-09-21 NOTE — Progress Notes (Signed)
SUBJECTIVE: The patient presents for follow-up of atrial fibrillation.  She has a history of epistaxis for which she underwent cauterization.  She has had no further issues with epistaxis.  The patient denies any symptoms of chest pain, palpitations, shortness of breath, lightheadedness, dizziness, leg swelling, orthopnea, PND, and syncope.  She recently underwent foot surgery about 2 weeks ago and it is healing well.  Her husband had a spinal abscess over the summer and was treated with IV vancomycin and developed red man syndrome.    Review of Systems: As per "subjective", otherwise negative.  Allergies  Allergen Reactions  . Zestril [Lisinopril]     Lips swelled  . Zocor [Simvastatin]     Leg cramps    Current Outpatient Medications  Medication Sig Dispense Refill  . amLODipine (NORVASC) 5 MG tablet Take 5 mg by mouth daily.    Marland Kitchen atorvastatin (LIPITOR) 10 MG tablet Take 10 mg by mouth daily.    . calcium carbonate (CALCIUM 600) 600 MG TABS tablet Take 600 mg by mouth 2 (two) times daily with a meal.    . chlorthalidone (HYGROTON) 25 MG tablet Take 25 mg by mouth daily.    Marland Kitchen CRANBERRY PO Take 1 capsule by mouth daily.    Marland Kitchen levothyroxine (SYNTHROID, LEVOTHROID) 25 MCG tablet Take 25 mcg by mouth daily before breakfast.    . metoprolol succinate (TOPROL-XL) 25 MG 24 hr tablet Take 25 mg by mouth daily.    . Multiple Vitamins-Minerals (MULTIVITAMIN WITH MINERALS) tablet Take 1 tablet by mouth daily.    . Omega 3-6-9 Fatty Acids (OMEGA 3-6-9 COMPLEX PO) Take 1 capsule by mouth daily.    . rivaroxaban (XARELTO) 20 MG TABS tablet TAKE (1) TABLET DAILY WITH SUPPER. 90 tablet 1  . SUPER B COMPLEX/C PO Take by mouth.    . theophylline (UNIPHYL) 600 MG 24 hr tablet Take 300 mg by mouth daily.    . VENTOLIN HFA 108 (90 Base) MCG/ACT inhaler Inhale 2 puffs into the lungs every 4 (four) hours as needed.     No current facility-administered medications for this visit.     Past  Medical History:  Diagnosis Date  . Arthritis   . Asthma   . Heart murmur    noted in 71 -no echo ever done  . Hypertension   . Hypothyroidism   . Wears glasses     Past Surgical History:  Procedure Laterality Date  . ABDOMINAL HYSTERECTOMY  2002  . CARPAL TUNNEL RELEASE  2005   both rt/lt  . COLONOSCOPY    . GREAT TOE ARTHRODESIS, INTERPHALANGEAL JOINT  2014   right  . GREAT TOE ARTHRODESIS, [place] PROCEDURE  2015   left  . PLANTAR FASCIA SURGERY  2015   left  . TOTAL SHOULDER ARTHROPLASTY  2012   right-[facility]  . TUBAL LIGATION      Social History   Socioeconomic History  . Marital status: Married    Spouse name: Not on file  . Number of children: Not on file  . Years of education: Not on file  . Highest education level: Not on file  Occupational History  . Not on file  Social Needs  . Financial resource strain: Not on file  . Food insecurity:    Worry: Not on file    Inability: Not on file  . Transportation needs:    Medical: Not on file    Non-medical: Not on file  Tobacco Use  .  Smoking status: Never Smoker  . Smokeless tobacco: Never Used  Substance and Sexual Activity  . Alcohol use: Yes    Comment: occ  . Drug use: No  . Sexual activity: Not on file  Lifestyle  . Physical activity:    Days per week: Not on file    Minutes per session: Not on file  . Stress: Not on file  Relationships  . Social connections:    Talks on phone: Not on file    Gets together: Not on file    Attends religious service: Not on file    Active member of club or organization: Not on file    Attends meetings of clubs or organizations: Not on file    Relationship status: Not on file  . Intimate partner violence:    Fear of current or ex partner: Not on file    Emotionally abused: Not on file    Physically abused: Not on file    Forced sexual activity: Not on file  Other Topics Concern  . Not on file  Social History Narrative  . Not on file     Vitals:    09/21/17 0958  BP: 110/72  Pulse: 71  SpO2: 97%  Weight: 158 lb (71.7 kg)  Height: 5\' 5"  (1.651 m)    Wt Readings from Last 3 Encounters:  09/21/17 158 lb (71.7 kg)  03/23/17 153 lb (69.4 kg)  02/03/17 148 lb (67.1 kg)     PHYSICAL EXAM General: NAD HEENT: Normal. Neck: No JVD, no thyromegaly. Lungs: Clear to auscultation bilaterally with normal respiratory effort. CV: Regular rate and irregularrhythm, normal S1/S2, no S3, no murmur. No pretibial or periankle edema.  No carotid bruit.   Abdomen: Soft, nontender, no distention.  Neurologic: Alert and oriented.  Psych: Normal affect. Skin: Normal. Musculoskeletal: No gross deformities.    ECG: Reviewed above under Subjective   Labs: Lab Results  Component Value Date/Time   K 2.6 (LL) 04/14/2015 05:16 AM   BUN 22 (H) 04/14/2015 05:16 AM   CREATININE 1.08 (H) 04/14/2015 05:16 AM   HGB 15.7 (H) 02/03/2017 02:55 PM     Lipids: No results found for: LDLCALC, LDLDIRECT, CHOL, TRIG, HDL     ASSESSMENT AND PLAN:  1.  Permanent atrial fibrillation: Symptomatically stable.  Continue metoprolol succinate 25 mg daily.  Continue Xarelto 20 mg daily for systemic anticoagulation.  2.  Chronic hypertension: Blood pressure is controlled.  No changes to therapy.  3.  Hyperlipidemia: Continue Lipitor 10 mg.   Disposition: Follow up 6 months as per patient's preference   Kate Sable, M.D., F.A.C.C.

## 2017-09-21 NOTE — Patient Instructions (Signed)

## 2017-10-18 DIAGNOSIS — M79672 Pain in left foot: Secondary | ICD-10-CM | POA: Diagnosis not present

## 2017-10-18 DIAGNOSIS — Z23 Encounter for immunization: Secondary | ICD-10-CM | POA: Diagnosis not present

## 2017-10-18 DIAGNOSIS — M79675 Pain in left toe(s): Secondary | ICD-10-CM | POA: Diagnosis not present

## 2017-10-18 DIAGNOSIS — M2022 Hallux rigidus, left foot: Secondary | ICD-10-CM | POA: Diagnosis not present

## 2017-12-05 DIAGNOSIS — Z1339 Encounter for screening examination for other mental health and behavioral disorders: Secondary | ICD-10-CM | POA: Diagnosis not present

## 2017-12-05 DIAGNOSIS — Z299 Encounter for prophylactic measures, unspecified: Secondary | ICD-10-CM | POA: Diagnosis not present

## 2017-12-05 DIAGNOSIS — Z1211 Encounter for screening for malignant neoplasm of colon: Secondary | ICD-10-CM | POA: Diagnosis not present

## 2017-12-05 DIAGNOSIS — E039 Hypothyroidism, unspecified: Secondary | ICD-10-CM | POA: Diagnosis not present

## 2017-12-05 DIAGNOSIS — Z0000 Encounter for general adult medical examination without abnormal findings: Secondary | ICD-10-CM | POA: Diagnosis not present

## 2017-12-05 DIAGNOSIS — R5383 Other fatigue: Secondary | ICD-10-CM | POA: Diagnosis not present

## 2017-12-05 DIAGNOSIS — E785 Hyperlipidemia, unspecified: Secondary | ICD-10-CM | POA: Diagnosis not present

## 2017-12-05 DIAGNOSIS — Z7189 Other specified counseling: Secondary | ICD-10-CM | POA: Diagnosis not present

## 2017-12-05 DIAGNOSIS — Z1331 Encounter for screening for depression: Secondary | ICD-10-CM | POA: Diagnosis not present

## 2017-12-05 DIAGNOSIS — Z6826 Body mass index (BMI) 26.0-26.9, adult: Secondary | ICD-10-CM | POA: Diagnosis not present

## 2017-12-05 DIAGNOSIS — Z79899 Other long term (current) drug therapy: Secondary | ICD-10-CM | POA: Diagnosis not present

## 2017-12-20 DIAGNOSIS — M79672 Pain in left foot: Secondary | ICD-10-CM | POA: Diagnosis not present

## 2017-12-20 DIAGNOSIS — M79671 Pain in right foot: Secondary | ICD-10-CM | POA: Diagnosis not present

## 2017-12-20 DIAGNOSIS — M25579 Pain in unspecified ankle and joints of unspecified foot: Secondary | ICD-10-CM | POA: Diagnosis not present

## 2018-01-18 DIAGNOSIS — D1801 Hemangioma of skin and subcutaneous tissue: Secondary | ICD-10-CM | POA: Diagnosis not present

## 2018-01-18 DIAGNOSIS — D239 Other benign neoplasm of skin, unspecified: Secondary | ICD-10-CM | POA: Diagnosis not present

## 2018-01-18 DIAGNOSIS — L570 Actinic keratosis: Secondary | ICD-10-CM | POA: Diagnosis not present

## 2018-03-07 DIAGNOSIS — E785 Hyperlipidemia, unspecified: Secondary | ICD-10-CM | POA: Diagnosis not present

## 2018-03-07 DIAGNOSIS — Z6826 Body mass index (BMI) 26.0-26.9, adult: Secondary | ICD-10-CM | POA: Diagnosis not present

## 2018-03-07 DIAGNOSIS — Z299 Encounter for prophylactic measures, unspecified: Secondary | ICD-10-CM | POA: Diagnosis not present

## 2018-03-07 DIAGNOSIS — J45909 Unspecified asthma, uncomplicated: Secondary | ICD-10-CM | POA: Diagnosis not present

## 2018-03-07 DIAGNOSIS — I10 Essential (primary) hypertension: Secondary | ICD-10-CM | POA: Diagnosis not present

## 2018-03-17 DIAGNOSIS — Z789 Other specified health status: Secondary | ICD-10-CM | POA: Diagnosis not present

## 2018-03-17 DIAGNOSIS — J45909 Unspecified asthma, uncomplicated: Secondary | ICD-10-CM | POA: Diagnosis not present

## 2018-03-17 DIAGNOSIS — Z299 Encounter for prophylactic measures, unspecified: Secondary | ICD-10-CM | POA: Diagnosis not present

## 2018-03-17 DIAGNOSIS — Z6826 Body mass index (BMI) 26.0-26.9, adult: Secondary | ICD-10-CM | POA: Diagnosis not present

## 2018-03-17 DIAGNOSIS — I10 Essential (primary) hypertension: Secondary | ICD-10-CM | POA: Diagnosis not present

## 2018-03-27 ENCOUNTER — Telehealth: Payer: Self-pay | Admitting: *Deleted

## 2018-03-27 ENCOUNTER — Other Ambulatory Visit: Payer: Self-pay | Admitting: *Deleted

## 2018-03-27 MED ORDER — RIVAROXABAN 20 MG PO TABS: ORAL_TABLET | ORAL | 6 refills | Status: DC

## 2018-03-27 NOTE — Telephone Encounter (Signed)
   Primary Cardiologist:  Dr. Bronson Ing  Patient contacted.  History reviewed.  No symptoms to suggest any unstable cardiac conditions.  Based on discussion, with current pandemic situation, we will be postponing this appointment till 07/06/2018.  If symptoms change, she has been instructed to contact our office.  Refill for Xarelto 20mg  sent to [facility] at patient request.     .

## 2018-03-30 ENCOUNTER — Ambulatory Visit: Payer: Medicare Other | Admitting: Cardiovascular Disease

## 2018-06-12 DIAGNOSIS — Z6826 Body mass index (BMI) 26.0-26.9, adult: Secondary | ICD-10-CM | POA: Diagnosis not present

## 2018-06-12 DIAGNOSIS — N390 Urinary tract infection, site not specified: Secondary | ICD-10-CM | POA: Diagnosis not present

## 2018-06-12 DIAGNOSIS — I10 Essential (primary) hypertension: Secondary | ICD-10-CM | POA: Diagnosis not present

## 2018-06-12 DIAGNOSIS — R300 Dysuria: Secondary | ICD-10-CM | POA: Diagnosis not present

## 2018-06-12 DIAGNOSIS — E039 Hypothyroidism, unspecified: Secondary | ICD-10-CM | POA: Diagnosis not present

## 2018-06-12 DIAGNOSIS — E785 Hyperlipidemia, unspecified: Secondary | ICD-10-CM | POA: Diagnosis not present

## 2018-06-12 DIAGNOSIS — Z299 Encounter for prophylactic measures, unspecified: Secondary | ICD-10-CM | POA: Diagnosis not present

## 2018-06-28 ENCOUNTER — Telehealth: Payer: Self-pay | Admitting: *Deleted

## 2018-06-28 DIAGNOSIS — M25461 Effusion, right knee: Secondary | ICD-10-CM | POA: Diagnosis not present

## 2018-06-28 DIAGNOSIS — M25561 Pain in right knee: Secondary | ICD-10-CM | POA: Diagnosis not present

## 2018-06-28 NOTE — Telephone Encounter (Signed)
Patient verbally consented for tele-health visits with [facility] and understands that her insurance company will be billed for the encounter.  Aware to have vitals available   

## 2018-07-06 ENCOUNTER — Telehealth (INDEPENDENT_AMBULATORY_CARE_PROVIDER_SITE_OTHER): Payer: Medicare Other | Admitting: Cardiovascular Disease

## 2018-07-06 ENCOUNTER — Encounter: Payer: Self-pay | Admitting: Cardiovascular Disease

## 2018-07-06 VITALS — BP 115/71 | HR 55 | Ht 65.0 in | Wt 154.0 lb

## 2018-07-06 DIAGNOSIS — Z7901 Long term (current) use of anticoagulants: Secondary | ICD-10-CM | POA: Diagnosis not present

## 2018-07-06 DIAGNOSIS — E785 Hyperlipidemia, unspecified: Secondary | ICD-10-CM

## 2018-07-06 DIAGNOSIS — I10 Essential (primary) hypertension: Secondary | ICD-10-CM | POA: Diagnosis not present

## 2018-07-06 DIAGNOSIS — I4821 Permanent atrial fibrillation: Secondary | ICD-10-CM

## 2018-07-06 NOTE — Patient Instructions (Signed)

## 2018-07-06 NOTE — Progress Notes (Signed)
Virtual Visit via Telephone Note   This visit type was conducted due to national recommendations for restrictions regarding the COVID-19 Pandemic (e.g. social distancing) in an effort to limit this patient's exposure and mitigate transmission in our community.  Due to her co-morbid illnesses, this patient is at least at moderate risk for complications without adequate follow up.  This format is felt to be most appropriate for this patient at this time.  The patient did not have access to video technology/had technical difficulties with video requiring transitioning to audio format only (telephone).  All issues noted in this document were discussed and addressed.  No physical exam could be performed with this format.  Please refer to the patient's chart for her  consent to telehealth for [facility].   Date:  07/06/2018   ID:  Melissa Faulkner, DOB 1945/08/05, MRN [id number]  Patient Location: Home Provider Location: Home  PCP:  Glenda Chroman, MD  Cardiologist:  Kate Sable, MD  Electrophysiologist:  None   Evaluation Performed:  Follow-Up Visit  Chief Complaint:  Atrial fibrillation  History of Present Illness:    Melissa Faulkner is a 73 y.o. female with atrial fibrillation. She has a history of epistaxis for which she underwent cauterization.  She has had no further issues with epistaxis.  The patient denies any symptoms of chest pain, palpitations, shortness of breath, lightheadedness, dizziness, leg swelling, orthopnea, PND, and syncope.   The patient does not have symptoms concerning for COVID-19 infection (fever, chills, cough, or new shortness of breath).    Past Medical History:  Diagnosis Date  . Arthritis   . Asthma   . Heart murmur    noted in 71 -no echo ever done  . Hypertension   . Hypothyroidism   . Wears glasses    Past Surgical History:  Procedure Laterality Date  . ABDOMINAL HYSTERECTOMY  2002  . CARPAL TUNNEL RELEASE  2005   both rt/lt  .  COLONOSCOPY    . GREAT TOE ARTHRODESIS, INTERPHALANGEAL JOINT  2014   right  . GREAT TOE ARTHRODESIS, [place] PROCEDURE  2015   left  . PLANTAR FASCIA SURGERY  2015   left  . TOTAL SHOULDER ARTHROPLASTY  2012   right-[facility]  . TUBAL LIGATION       Current Meds  Medication Sig  . amLODipine (NORVASC) 5 MG tablet Take 5 mg by mouth daily.  Marland Kitchen atorvastatin (LIPITOR) 10 MG tablet Take 10 mg by mouth daily.  . calcium carbonate (CALCIUM 600) 600 MG TABS tablet Take 600 mg by mouth 2 (two) times daily with a meal.  . chlorthalidone (HYGROTON) 25 MG tablet Take 25 mg by mouth daily.  Marland Kitchen CRANBERRY PO Take 1 capsule by mouth daily.  Marland Kitchen levothyroxine (SYNTHROID, LEVOTHROID) 25 MCG tablet Take 25 mcg by mouth daily before breakfast.  . metoprolol succinate (TOPROL-XL) 25 MG 24 hr tablet Take 25 mg by mouth daily.  . Multiple Vitamins-Minerals (MULTIVITAMIN WITH MINERALS) tablet Take 1 tablet by mouth daily.  . Omega 3-6-9 Fatty Acids (OMEGA 3-6-9 COMPLEX PO) Take 1 capsule by mouth daily.  . rivaroxaban (XARELTO) 20 MG TABS tablet TAKE (1) TABLET DAILY WITH SUPPER.  . SUPER B COMPLEX/C PO Take by mouth.  . theophylline (UNIPHYL) 600 MG 24 hr tablet Take 300 mg by mouth 2 (two) times a day.   . VENTOLIN HFA 108 (90 Base) MCG/ACT inhaler Inhale 2 puffs into the lungs every 4 (four) hours as needed.  Allergies:   Zestril [lisinopril] and Zocor [simvastatin]   Social History   Tobacco Use  . Smoking status: Never Smoker  . Smokeless tobacco: Never Used  Substance Use Topics  . Alcohol use: Yes    Comment: occ  . Drug use: No     Family Hx: The patient's family history includes Clotting disorder in her mother; Stomach cancer in her father.  ROS:   Please see the history of present illness.     All other systems reviewed and are negative.   Prior CV studies:   The following studies were reviewed today:  NA  Labs/Other Tests and Data Reviewed:    EKG:  No ECG reviewed.   Recent Labs: No results found for requested labs within last 8760 hours.   Recent Lipid Panel No results found for: CHOL, TRIG, HDL, CHOLHDL, LDLCALC, LDLDIRECT  Wt Readings from Last 3 Encounters:  07/06/18 154 lb (69.9 kg)  09/21/17 158 lb (71.7 kg)  03/23/17 153 lb (69.4 kg)     Objective:    Vital Signs:  BP 115/71   Pulse (!) 55   Ht 5\' 5"  (1.651 m)   Wt 154 lb (69.9 kg)   BMI 25.63 kg/m    VITAL SIGNS:  reviewed  ASSESSMENT & PLAN:    1. Permanent atrial fibrillation: Symptomatically stable. Continue metoprolol succinate 25 mg daily. Continue Xarelto 20 mg daily for systemic anticoagulation.  2. Chronic hypertension: Blood pressure is controlled. No changes to therapy.  3. Hyperlipidemia: Continue Lipitor 10 mg.  COVID-19 Education: The signs and symptoms of COVID-19 were discussed with the patient and how to seek care for testing (follow up with PCP or arrange E-visit).  The importance of social distancing was discussed today.  Time:   Today, I have spent 10 minutes with the patient with telehealth technology discussing the above problems.     Medication Adjustments/Labs and Tests Ordered: Current medicines are reviewed at length with the patient today.  Concerns regarding medicines are outlined above.   Tests Ordered: No orders of the defined types were placed in this encounter.   Medication Changes: No orders of the defined types were placed in this encounter.   Follow Up:  Virtual Visit or In Person in 6 month(s)  Signed, Kate Sable, MD  07/06/2018 2:38 PM    [place]

## 2018-08-04 ENCOUNTER — Other Ambulatory Visit: Payer: Medicare Other

## 2018-08-04 ENCOUNTER — Other Ambulatory Visit: Payer: Self-pay

## 2018-08-04 DIAGNOSIS — Z20822 Contact with and (suspected) exposure to covid-19: Secondary | ICD-10-CM

## 2018-08-04 DIAGNOSIS — R6889 Other general symptoms and signs: Secondary | ICD-10-CM | POA: Diagnosis not present

## 2018-08-06 LAB — NOVEL CORONAVIRUS, NAA: SARS-CoV-2, NAA: NOT DETECTED

## 2018-09-04 DIAGNOSIS — I4891 Unspecified atrial fibrillation: Secondary | ICD-10-CM | POA: Diagnosis not present

## 2018-09-04 DIAGNOSIS — I10 Essential (primary) hypertension: Secondary | ICD-10-CM | POA: Diagnosis not present

## 2018-09-04 DIAGNOSIS — E7800 Pure hypercholesterolemia, unspecified: Secondary | ICD-10-CM | POA: Diagnosis not present

## 2018-09-12 DIAGNOSIS — I499 Cardiac arrhythmia, unspecified: Secondary | ICD-10-CM | POA: Diagnosis not present

## 2018-09-12 DIAGNOSIS — E039 Hypothyroidism, unspecified: Secondary | ICD-10-CM | POA: Diagnosis not present

## 2018-09-12 DIAGNOSIS — Z299 Encounter for prophylactic measures, unspecified: Secondary | ICD-10-CM | POA: Diagnosis not present

## 2018-09-12 DIAGNOSIS — E785 Hyperlipidemia, unspecified: Secondary | ICD-10-CM | POA: Diagnosis not present

## 2018-09-12 DIAGNOSIS — Z6827 Body mass index (BMI) 27.0-27.9, adult: Secondary | ICD-10-CM | POA: Diagnosis not present

## 2018-09-12 DIAGNOSIS — I4891 Unspecified atrial fibrillation: Secondary | ICD-10-CM | POA: Diagnosis not present

## 2018-09-12 DIAGNOSIS — I10 Essential (primary) hypertension: Secondary | ICD-10-CM | POA: Diagnosis not present

## 2018-09-14 DIAGNOSIS — Z23 Encounter for immunization: Secondary | ICD-10-CM | POA: Diagnosis not present

## 2018-10-03 ENCOUNTER — Other Ambulatory Visit: Payer: Self-pay | Admitting: Nurse Practitioner

## 2018-10-03 DIAGNOSIS — Z1231 Encounter for screening mammogram for malignant neoplasm of breast: Secondary | ICD-10-CM

## 2018-11-17 ENCOUNTER — Ambulatory Visit: Payer: Medicare Other

## 2018-12-12 ENCOUNTER — Encounter: Payer: Self-pay | Admitting: Family Medicine

## 2018-12-12 DIAGNOSIS — Z0000 Encounter for general adult medical examination without abnormal findings: Secondary | ICD-10-CM | POA: Diagnosis not present

## 2018-12-12 DIAGNOSIS — Z299 Encounter for prophylactic measures, unspecified: Secondary | ICD-10-CM | POA: Diagnosis not present

## 2018-12-12 DIAGNOSIS — R5383 Other fatigue: Secondary | ICD-10-CM | POA: Diagnosis not present

## 2018-12-12 DIAGNOSIS — Z79899 Other long term (current) drug therapy: Secondary | ICD-10-CM | POA: Diagnosis not present

## 2018-12-12 DIAGNOSIS — Z1339 Encounter for screening examination for other mental health and behavioral disorders: Secondary | ICD-10-CM | POA: Diagnosis not present

## 2018-12-12 DIAGNOSIS — I4891 Unspecified atrial fibrillation: Secondary | ICD-10-CM | POA: Diagnosis not present

## 2018-12-12 DIAGNOSIS — Z7189 Other specified counseling: Secondary | ICD-10-CM | POA: Diagnosis not present

## 2018-12-12 DIAGNOSIS — I10 Essential (primary) hypertension: Secondary | ICD-10-CM | POA: Diagnosis not present

## 2018-12-12 DIAGNOSIS — E785 Hyperlipidemia, unspecified: Secondary | ICD-10-CM | POA: Diagnosis not present

## 2018-12-12 DIAGNOSIS — Z1211 Encounter for screening for malignant neoplasm of colon: Secondary | ICD-10-CM | POA: Diagnosis not present

## 2018-12-12 DIAGNOSIS — E039 Hypothyroidism, unspecified: Secondary | ICD-10-CM | POA: Diagnosis not present

## 2018-12-12 DIAGNOSIS — Z6827 Body mass index (BMI) 27.0-27.9, adult: Secondary | ICD-10-CM | POA: Diagnosis not present

## 2018-12-12 DIAGNOSIS — Z1331 Encounter for screening for depression: Secondary | ICD-10-CM | POA: Diagnosis not present

## 2018-12-13 ENCOUNTER — Ambulatory Visit
Admission: RE | Admit: 2018-12-13 | Discharge: 2018-12-13 | Disposition: A | Discharge status: Home / Self Care | Payer: Medicare Other | Source: Ambulatory Visit | Attending: Nurse Practitioner | Admitting: Nurse Practitioner

## 2018-12-13 ENCOUNTER — Other Ambulatory Visit: Payer: Self-pay

## 2018-12-13 DIAGNOSIS — Z1231 Encounter for screening mammogram for malignant neoplasm of breast: Secondary | ICD-10-CM | POA: Diagnosis not present

## 2018-12-19 ENCOUNTER — Ambulatory Visit: Payer: Medicare Other | Attending: Internal Medicine

## 2018-12-19 ENCOUNTER — Other Ambulatory Visit: Payer: Self-pay

## 2018-12-19 DIAGNOSIS — Z20822 Contact with and (suspected) exposure to covid-19: Secondary | ICD-10-CM

## 2018-12-20 LAB — NOVEL CORONAVIRUS, NAA: SARS-CoV-2, NAA: NOT DETECTED

## 2018-12-25 ENCOUNTER — Ambulatory Visit (INDEPENDENT_AMBULATORY_CARE_PROVIDER_SITE_OTHER): Payer: Medicare Other | Admitting: Cardiovascular Disease

## 2018-12-25 ENCOUNTER — Other Ambulatory Visit: Payer: Self-pay

## 2018-12-25 ENCOUNTER — Encounter: Payer: Self-pay | Admitting: Cardiovascular Disease

## 2018-12-25 VITALS — BP 112/64 | HR 73 | Ht 65.0 in | Wt 159.0 lb

## 2018-12-25 DIAGNOSIS — Z7901 Long term (current) use of anticoagulants: Secondary | ICD-10-CM | POA: Diagnosis not present

## 2018-12-25 DIAGNOSIS — E785 Hyperlipidemia, unspecified: Secondary | ICD-10-CM

## 2018-12-25 DIAGNOSIS — I10 Essential (primary) hypertension: Secondary | ICD-10-CM | POA: Diagnosis not present

## 2018-12-25 DIAGNOSIS — I4821 Permanent atrial fibrillation: Secondary | ICD-10-CM | POA: Diagnosis not present

## 2018-12-25 MED ORDER — METOPROLOL SUCCINATE ER 25 MG PO TB24: 25.0000 mg | ORAL_TABLET | Freq: Every day | ORAL | 3 refills | Status: DC

## 2018-12-25 MED ORDER — RIVAROXABAN 20 MG PO TABS: ORAL_TABLET | ORAL | 6 refills | Status: DC

## 2018-12-25 NOTE — Patient Instructions (Signed)
Medication Instructions:  Your physician recommends that you continue on your current medications as directed. Please refer to the Current Medication list given to you today.  *If you need a refill on your cardiac medications before your next appointment, please call your pharmacy*  Lab Work: None today If you have labs (blood work) drawn today and your tests are completely normal, you will receive your results only by: . MyChart Message (if you have MyChart) OR . A paper copy in the mail If you have any lab test that is abnormal or we need to change your treatment, we will call you to review the results.  Testing/Procedures: None today  Follow-Up: At [facility], you and your health needs are our priority.  As part of our continuing mission to provide you with exceptional heart care, we have created designated Provider Care Teams.  These Care Teams include your primary Cardiologist (physician) and Advanced Practice Providers (APPs -  Physician Assistants and Nurse Practitioners) who all work together to provide you with the care you need, when you need it.  Your next appointment:   6 month(s)  The format for your next appointment:   Virtual Visit   Provider:   Suresh Koneswaran, MD  Other Instructions None     Thank you for choosing [facility] !         

## 2018-12-25 NOTE — Progress Notes (Signed)
SUBJECTIVE: Melissa Faulkner presents for routine follow-up.  She has a history of permanent atrial fibrillation and hypertension.  ECG performed today which I personally reviewed demonstrates rate controlled atrial fibrillation.  She denies chest pain, leg swelling, orthopnea, paroxysmal atrial dyspnea.  She has palpitations on occasion.  If she exerts herself too quickly she becomes short of breath but this resolves with a brief rest and then she can continue on with activities of daily living.  Primary complaints relate to right hip and knee pain.    Review of Systems: As per "subjective", otherwise negative.  Allergies  Allergen Reactions  . Zestril [Lisinopril]     Lips swelled  . Zocor [Simvastatin]     Leg cramps    Current Outpatient Medications  Medication Sig Dispense Refill  . amLODipine (NORVASC) 5 MG tablet Take 5 mg by mouth daily.    Marland Kitchen atorvastatin (LIPITOR) 10 MG tablet Take 10 mg by mouth daily.    . calcium carbonate (CALCIUM 600) 600 MG TABS tablet Take 600 mg by mouth 2 (two) times daily with a meal.    . chlorthalidone (HYGROTON) 25 MG tablet Take 25 mg by mouth daily.    Marland Kitchen CRANBERRY PO Take 1 capsule by mouth daily.    Marland Kitchen levothyroxine (SYNTHROID) 50 MCG tablet Take 50 mcg by mouth daily before breakfast.    . metoprolol succinate (TOPROL-XL) 25 MG 24 hr tablet Take 25 mg by mouth daily.    . Multiple Vitamins-Minerals (MULTIVITAMIN WITH MINERALS) tablet Take 1 tablet by mouth daily.    . Omega 3-6-9 Fatty Acids (OMEGA 3-6-9 COMPLEX PO) Take 1 capsule by mouth daily.    . rivaroxaban (XARELTO) 20 MG TABS tablet TAKE (1) TABLET DAILY WITH SUPPER. 30 tablet 6  . SUPER B COMPLEX/C PO Take by mouth.    . theophylline (UNIPHYL) 600 MG 24 hr tablet Take 300 mg by mouth 2 (two) times a day.     . VENTOLIN HFA 108 (90 Base) MCG/ACT inhaler Inhale 2 puffs into the lungs every 4 (four) hours as needed.     No current facility-administered medications for this visit.     Past Medical History:  Diagnosis Date  . Arthritis   . Asthma   . Heart murmur    noted in 71 -no echo ever done  . Hypertension   . Hypothyroidism   . Wears glasses     Past Surgical History:  Procedure Laterality Date  . ABDOMINAL HYSTERECTOMY  2002  . CARPAL TUNNEL RELEASE  2005   both rt/lt  . COLONOSCOPY    . GREAT TOE ARTHRODESIS, INTERPHALANGEAL JOINT  2014   right  . GREAT TOE ARTHRODESIS, [place] PROCEDURE  2015   left  . PLANTAR FASCIA SURGERY  2015   left  . TOTAL SHOULDER ARTHROPLASTY  2012   [facility]  . TUBAL LIGATION      Social History   Socioeconomic History  . Marital status: Married    Spouse name: Not on file  . Number of children: Not on file  . Years of education: Not on file  . Highest education level: Not on file  Occupational History  . Not on file  Tobacco Use  . Smoking status: Never Smoker  . Smokeless tobacco: Never Used  Substance and Sexual Activity  . Alcohol use: Yes    Comment: occ  . Drug use: No  . Sexual activity: Not on file  Other Topics Concern  .  Not on file  Social History Narrative  . Not on file   Social Determinants of Health   Financial Resource Strain:   . Difficulty of Paying Living Expenses: Not on file  Food Insecurity:   . Worried About Charity fundraiser in the Last Year: Not on file  . Ran Out of Food in the Last Year: Not on file  Transportation Needs:   . Lack of Transportation (Medical): Not on file  . Lack of Transportation (Non-Medical): Not on file  Physical Activity:   . Days of Exercise per Week: Not on file  . Minutes of Exercise per Session: Not on file  Stress:   . Feeling of Stress : Not on file  Social Connections:   . Frequency of Communication with Friends and Family: Not on file  . Frequency of Social Gatherings with Friends and Family: Not on file  . Attends Religious Services: Not on file  . Active Member of Clubs or Organizations: Not on file  . Attends Theatre manager Meetings: Not on file  . Marital Status: Not on file  Intimate Partner Violence:   . Fear of Current or Ex-Partner: Not on file  . Emotionally Abused: Not on file  . Physically Abused: Not on file  . Sexually Abused: Not on file     Vitals:   12/25/18 1302  BP: 112/64  Pulse: 73  SpO2: 97%  Weight: 159 lb (72.1 kg)  Height: 5\' 5"  (1.651 m)    Wt Readings from Last 3 Encounters:  12/25/18 159 lb (72.1 kg)  07/06/18 154 lb (69.9 kg)  09/21/17 158 lb (71.7 kg)     PHYSICAL EXAM General: NAD HEENT: Normal. Neck: No JVD, no thyromegaly. Lungs: Clear to auscultation bilaterally with normal respiratory effort. CV: Regular rate and irregular rhythm, normal S1/S2, no S3, no murmur. No pretibial or periankle edema.  No carotid bruit.   Abdomen: Soft, nontender, no distention.  Neurologic: Alert and oriented.  Psych: Normal affect. Skin: Normal. Musculoskeletal: No gross deformities.      Labs: Lab Results  Component Value Date/Time   K 2.6 (LL) 04/14/2015 05:16 AM   BUN 22 (H) 04/14/2015 05:16 AM   CREATININE 1.08 (H) 04/14/2015 05:16 AM   HGB 15.7 (H) 02/03/2017 02:55 PM     Lipids: No results found for: LDLCALC, LDLDIRECT, CHOL, TRIG, HDL     ASSESSMENT AND PLAN:  1. Permanent atrial fibrillation: Symptomatically stable. Continue metoprolol succinate 25 mg daily. Continue Xarelto 20 mg daily for systemic anticoagulation.  2. Hypertension: Blood pressure is controlled. No changes to therapy.  3. Hyperlipidemia: Continue Lipitor 10 mg.   Disposition: Follow up 6 months virtual visit   Melissa Faulkner, M.D., F.A.C.C.

## 2019-01-25 ENCOUNTER — Ambulatory Visit: Payer: Medicare Other | Attending: Internal Medicine

## 2019-01-25 DIAGNOSIS — Z23 Encounter for immunization: Secondary | ICD-10-CM | POA: Insufficient documentation

## 2019-01-25 NOTE — Progress Notes (Signed)
   Covid-19 Vaccination Clinic  Name:  Melissa Faulkner    MRN: [id number] DOB: 1945-07-19  01/25/2019  [name] was observed post Covid-19 immunization for 15 minutes without incidence. She was provided with Vaccine Information Sheet and instruction to access the V-Safe system.   [name] was instructed to call 911 with any severe reactions post vaccine: Marland Kitchen Difficulty breathing  . Swelling of your face and throat  . A fast heartbeat  . A bad rash all over your body  . Dizziness and weakness    Immunizations Administered    Name Date Dose VIS Date Route   Pfizer COVID-19 Vaccine 01/25/2019  8:50 AM 0.3 mL 12/15/2018 Intramuscular   Manufacturer: [place]   Lot: [id number]   [place]: [id number]

## 2019-02-15 ENCOUNTER — Ambulatory Visit: Payer: Medicare Other | Attending: Internal Medicine

## 2019-02-15 DIAGNOSIS — Z23 Encounter for immunization: Secondary | ICD-10-CM | POA: Insufficient documentation

## 2019-02-15 NOTE — Progress Notes (Signed)
   Covid-19 Vaccination Clinic  Name:  Melissa Faulkner    MRN: [id number] DOB: Feb 24, 1945  02/15/2019  [name] was observed post Covid-19 immunization for 15 minutes without incidence. She was provided with Vaccine Information Sheet and instruction to access the V-Safe system.   [name] was instructed to call 911 with any severe reactions post vaccine: Marland Kitchen Difficulty breathing  . Swelling of your face and throat  . A fast heartbeat  . A bad rash all over your body  . Dizziness and weakness    Immunizations Administered    Name Date Dose VIS Date Route   Pfizer COVID-19 Vaccine 02/15/2019  8:19 AM 0.3 mL 12/15/2018 Intramuscular   Manufacturer: [place]   Lot: [id number]   [place]: [id number]

## 2019-02-20 DIAGNOSIS — L570 Actinic keratosis: Secondary | ICD-10-CM | POA: Diagnosis not present

## 2019-02-20 DIAGNOSIS — D1801 Hemangioma of skin and subcutaneous tissue: Secondary | ICD-10-CM | POA: Diagnosis not present

## 2019-02-20 DIAGNOSIS — L819 Disorder of pigmentation, unspecified: Secondary | ICD-10-CM | POA: Diagnosis not present

## 2019-03-07 DIAGNOSIS — M4040 Postural lordosis, site unspecified: Secondary | ICD-10-CM | POA: Diagnosis not present

## 2019-03-07 DIAGNOSIS — M19042 Primary osteoarthritis, left hand: Secondary | ICD-10-CM | POA: Diagnosis not present

## 2019-03-07 DIAGNOSIS — M720 Palmar fascial fibromatosis [Dupuytren]: Secondary | ICD-10-CM | POA: Diagnosis not present

## 2019-03-07 DIAGNOSIS — M79642 Pain in left hand: Secondary | ICD-10-CM | POA: Diagnosis not present

## 2019-03-12 ENCOUNTER — Other Ambulatory Visit: Payer: Self-pay | Admitting: Orthopedic Surgery

## 2019-03-12 DIAGNOSIS — M25461 Effusion, right knee: Secondary | ICD-10-CM | POA: Diagnosis not present

## 2019-03-12 DIAGNOSIS — M25561 Pain in right knee: Secondary | ICD-10-CM | POA: Diagnosis not present

## 2019-03-12 DIAGNOSIS — M79642 Pain in left hand: Secondary | ICD-10-CM

## 2019-03-15 ENCOUNTER — Other Ambulatory Visit: Payer: Self-pay

## 2019-03-15 ENCOUNTER — Ambulatory Visit
Admission: RE | Admit: 2019-03-15 | Discharge: 2019-03-15 | Disposition: A | Discharge status: Home / Self Care | Payer: Medicare Other | Source: Ambulatory Visit | Attending: Orthopedic Surgery | Admitting: Orthopedic Surgery

## 2019-03-15 DIAGNOSIS — R2232 Localized swelling, mass and lump, left upper limb: Secondary | ICD-10-CM | POA: Diagnosis not present

## 2019-03-15 DIAGNOSIS — M79642 Pain in left hand: Secondary | ICD-10-CM

## 2019-03-19 DIAGNOSIS — M720 Palmar fascial fibromatosis [Dupuytren]: Secondary | ICD-10-CM | POA: Diagnosis not present

## 2019-03-20 DIAGNOSIS — I4892 Unspecified atrial flutter: Secondary | ICD-10-CM | POA: Diagnosis not present

## 2019-03-20 DIAGNOSIS — Z299 Encounter for prophylactic measures, unspecified: Secondary | ICD-10-CM | POA: Diagnosis not present

## 2019-03-20 DIAGNOSIS — I10 Essential (primary) hypertension: Secondary | ICD-10-CM | POA: Diagnosis not present

## 2019-03-20 DIAGNOSIS — E039 Hypothyroidism, unspecified: Secondary | ICD-10-CM | POA: Diagnosis not present

## 2019-03-20 DIAGNOSIS — I4891 Unspecified atrial fibrillation: Secondary | ICD-10-CM | POA: Diagnosis not present

## 2019-03-21 DIAGNOSIS — M25561 Pain in right knee: Secondary | ICD-10-CM | POA: Diagnosis not present

## 2019-03-21 DIAGNOSIS — M25461 Effusion, right knee: Secondary | ICD-10-CM | POA: Diagnosis not present

## 2019-03-21 DIAGNOSIS — M1711 Unilateral primary osteoarthritis, right knee: Secondary | ICD-10-CM | POA: Diagnosis not present

## 2019-03-21 DIAGNOSIS — S83281A Other tear of lateral meniscus, current injury, right knee, initial encounter: Secondary | ICD-10-CM | POA: Diagnosis not present

## 2019-03-21 DIAGNOSIS — M7121 Synovial cyst of popliteal space [Baker], right knee: Secondary | ICD-10-CM | POA: Diagnosis not present

## 2019-03-26 DIAGNOSIS — S83271D Complex tear of lateral meniscus, current injury, right knee, subsequent encounter: Secondary | ICD-10-CM | POA: Diagnosis not present

## 2019-03-30 DIAGNOSIS — Z01818 Encounter for other preprocedural examination: Secondary | ICD-10-CM | POA: Diagnosis not present

## 2019-04-03 DIAGNOSIS — E7800 Pure hypercholesterolemia, unspecified: Secondary | ICD-10-CM | POA: Diagnosis not present

## 2019-04-03 DIAGNOSIS — S83271A Complex tear of lateral meniscus, current injury, right knee, initial encounter: Secondary | ICD-10-CM | POA: Diagnosis not present

## 2019-04-03 DIAGNOSIS — S83241A Other tear of medial meniscus, current injury, right knee, initial encounter: Secondary | ICD-10-CM | POA: Diagnosis not present

## 2019-04-03 DIAGNOSIS — I4891 Unspecified atrial fibrillation: Secondary | ICD-10-CM | POA: Diagnosis not present

## 2019-04-03 DIAGNOSIS — E039 Hypothyroidism, unspecified: Secondary | ICD-10-CM | POA: Diagnosis not present

## 2019-04-03 DIAGNOSIS — I10 Essential (primary) hypertension: Secondary | ICD-10-CM | POA: Diagnosis not present

## 2019-04-03 DIAGNOSIS — S83281A Other tear of lateral meniscus, current injury, right knee, initial encounter: Secondary | ICD-10-CM | POA: Diagnosis not present

## 2019-04-03 DIAGNOSIS — M25461 Effusion, right knee: Secondary | ICD-10-CM | POA: Diagnosis not present

## 2019-04-03 DIAGNOSIS — M1711 Unilateral primary osteoarthritis, right knee: Secondary | ICD-10-CM | POA: Diagnosis not present

## 2019-04-09 DIAGNOSIS — E876 Hypokalemia: Secondary | ICD-10-CM | POA: Diagnosis not present

## 2019-05-18 DIAGNOSIS — M5030 Other cervical disc degeneration, unspecified cervical region: Secondary | ICD-10-CM | POA: Diagnosis not present

## 2019-05-18 DIAGNOSIS — M79642 Pain in left hand: Secondary | ICD-10-CM | POA: Diagnosis not present

## 2019-05-18 DIAGNOSIS — M542 Cervicalgia: Secondary | ICD-10-CM | POA: Diagnosis not present

## 2019-06-20 ENCOUNTER — Ambulatory Visit (INDEPENDENT_AMBULATORY_CARE_PROVIDER_SITE_OTHER): Payer: Medicare Other | Admitting: Family Medicine

## 2019-06-20 ENCOUNTER — Encounter: Payer: Self-pay | Admitting: Family Medicine

## 2019-06-20 ENCOUNTER — Encounter: Payer: Self-pay | Admitting: *Deleted

## 2019-06-20 VITALS — BP 120/70 | HR 84 | Ht 65.0 in | Wt 157.4 lb

## 2019-06-20 DIAGNOSIS — I4821 Permanent atrial fibrillation: Secondary | ICD-10-CM

## 2019-06-20 DIAGNOSIS — E785 Hyperlipidemia, unspecified: Secondary | ICD-10-CM | POA: Diagnosis not present

## 2019-06-20 DIAGNOSIS — I10 Essential (primary) hypertension: Secondary | ICD-10-CM | POA: Diagnosis not present

## 2019-06-20 NOTE — Patient Instructions (Signed)
Medication Instructions:    Your physician recommends that you continue on your current medications as directed. Please refer to the Current Medication list given to you today.  Labwork:  NONE  Testing/Procedures:  NONE  Follow-Up:  Your physician recommends that you schedule a follow-up appointment in: 6 months (office)  Any Other Special Instructions Will Be Listed Below (If Applicable).  If you need a refill on your cardiac medications before your next appointment, please call your pharmacy. 

## 2019-06-20 NOTE — Progress Notes (Addendum)
Cardiology Office Note  Date: 06/20/2019   ID: Melissa Faulkner, DOB 1945-07-31, MRN [id number]  PCP:  Glenda Chroman, MD  Cardiologist:  Kate Sable, MD Electrophysiologist:  None   Chief Complaint: Follow-up permanent atrial fibrillation, HTN, HLD  History of Present Illness: Melissa Faulkner is a 74 y.o. female with a history of permanent atrial fibrillation, hypertension, hyperlipidemia.  Last saw Dr. Bronson Ing via office visit 12/25/2018.  At that time she denied any chest pain, lower extremity edema, PND, orthopnea.  Had palpitations on occasion.  If exerting herself too quickly become short of breath would resolve with brief rest.  Primary complaints were related to right hip and knee pain.  She was to continue her Toprol 25 mg daily along with Xarelto 20 mg daily.  Also to continue Lipitor 10 mg.  Blood pressure well controlled.  She is here for 50-month follow-up.  She is status post right knee surgery for torn ligament and is recovering from that.  States she is having a lot of pain related to the surgery.  She denies any other cardiac issues such as anginal or exertional symptoms, orthostatic symptoms, PND orthopnea, bleeding issues, claudication, DVT or PE-like symptoms, or lower extremity edema.  She states her right lower leg has been swelling some which she attributes to the knee surgery.  Past Medical History:  Diagnosis Date  . Arthritis   . Asthma   . Heart murmur    noted in 71 -no echo ever done  . Hypertension   . Hypothyroidism   . Wears glasses     Past Surgical History:  Procedure Laterality Date  . ABDOMINAL HYSTERECTOMY  2002  . CARPAL TUNNEL RELEASE  2005   both rt/lt  . COLONOSCOPY    . GREAT TOE ARTHRODESIS, INTERPHALANGEAL JOINT  2014   right  . GREAT TOE ARTHRODESIS, [place] PROCEDURE  2015   left  . PLANTAR FASCIA SURGERY  2015   left  . TOTAL SHOULDER ARTHROPLASTY  2012   [facility]  . TUBAL LIGATION      Current Outpatient  Medications  Medication Sig Dispense Refill  . amLODipine (NORVASC) 5 MG tablet Take 5 mg by mouth daily.    Marland Kitchen atorvastatin (LIPITOR) 10 MG tablet Take 10 mg by mouth daily.    . calcium carbonate (CALCIUM 600) 600 MG TABS tablet Take 600 mg by mouth 2 (two) times daily with a meal.    . chlorthalidone (HYGROTON) 50 MG tablet Take 50 mg by mouth daily.    Marland Kitchen CRANBERRY PO Take 1 capsule by mouth daily.    Marland Kitchen levothyroxine (SYNTHROID) 25 MCG tablet Take 25 mcg by mouth daily before breakfast.    . metoprolol succinate (TOPROL-XL) 25 MG 24 hr tablet Take 1 tablet (25 mg total) by mouth daily. 90 tablet 3  . Misc Natural Products (OSTEO BI-FLEX TRIPLE STRENGTH PO) Take 0.5 tablets by mouth daily.    . Multiple Vitamins-Minerals (MULTIVITAMIN WITH MINERALS) tablet Take 1 tablet by mouth daily.    . Omega 3-6-9 Fatty Acids (OMEGA 3-6-9 COMPLEX PO) Take 1 capsule by mouth daily.    . rivaroxaban (XARELTO) 20 MG TABS tablet TAKE (1) TABLET DAILY WITH SUPPER. 30 tablet 6  . SUPER B COMPLEX/C PO Take by mouth.    . theophylline (UNIPHYL) 600 MG 24 hr tablet Take 300 mg by mouth 2 (two) times a day.     . VENTOLIN HFA 108 (90 Base) MCG/ACT inhaler Inhale 2 puffs  into the lungs every 4 (four) hours as needed.    . vitamin B-12 (CYANOCOBALAMIN) 500 MCG tablet Take 500 mcg by mouth daily.     No current facility-administered medications for this visit.   Allergies:  Zestril [lisinopril] and Zocor [simvastatin]   Social History: The patient  reports that she has never smoked. She has never used smokeless tobacco. She reports current alcohol use. She reports that she does not use drugs.   Family History: The patient's family history includes Clotting disorder in her mother; Stomach cancer in her father.   ROS:  Please see the history of present illness. Otherwise, complete review of systems is positive for none.  All other systems are reviewed and negative.   Physical Exam: VS:  BP 120/70   Pulse 84    Ht 5\' 5"  (1.651 m)   Wt 157 lb 6.4 oz (71.4 kg)   SpO2 98%   BMI 26.19 kg/m , BMI Body mass index is 26.19 kg/m.  Wt Readings from Last 3 Encounters:  06/20/19 157 lb 6.4 oz (71.4 kg)  12/25/18 159 lb (72.1 kg)  07/06/18 154 lb (69.9 kg)    General: Patient appears comfortable at rest. Neck: Supple, no elevated JVP or carotid bruits, no thyromegaly. Lungs: Clear to auscultation, nonlabored breathing at rest. Cardiac: Irregularly irregular rate and rhythm, no S3 or significant systolic murmur, no pericardial rub. Extremities: No pitting edema, distal pulses 2+. Skin: Warm and dry. Musculoskeletal: No kyphosis. Neuropsychiatric: Alert and oriented x3, affect grossly appropriate.  ECG:  12/25/2018 EKG shows atrial fibrillation with a rate of 75.  Recent Labwork: No results found for requested labs within last 8760 hours.  No results found for: CHOL, TRIG, HDL, CHOLHDL, VLDL, LDLCALC, LDLDIRECT  Other Studies Reviewed Today:  Echocardiogram 12/17/2015 Study Conclusions   - Left ventricle: The cavity size was normal. Wall thickness was  normal. Systolic function was normal. The estimated ejection  fraction was in the range of 60% to 65%. Wall motion was normal;  there were no regional wall motion abnormalities. The study is  not technically sufficient to allow evaluation of LV diastolic  function.  - Mitral valve: Mildly calcified annulus. Mildly thickened leaflets  . There was mild regurgitation.  - Left atrium: The atrium was moderately dilated.   Assessment and Plan:  1. Permanent atrial fibrillation ([place])   2. Essential hypertension   3. Dyslipidemia    1. Permanent atrial fibrillation ([facility]) Heart rate today 84 and irregularly irregular.  Continue Toprol-XL 25 mg daily.  Continue Xarelto 20 mg daily.  2. Essential hypertension Patient is normotensive today with a blood pressure 120/70.  Continue chlorthalidone 50 mg daily, Toprol-XL 25 mg daily.  3.  Dyslipidemia No recent lipid profile.  We will attempt to obtain from PCP.  Continue atorvastatin 10 mg daily.  Medication Adjustments/Labs and Tests Ordered: Current medicines are reviewed at length with the patient today.  Concerns regarding medicines are outlined above.   Disposition: Follow-up with Dr. Bronson Ing or APP 6 months  Signed, Levell July, NP 06/20/2019 3:40 PM    [facility] at [address], [place], [place] [address] Phone: [phone]; Fax: [phone]

## 2019-06-21 DIAGNOSIS — I10 Essential (primary) hypertension: Secondary | ICD-10-CM | POA: Diagnosis not present

## 2019-06-21 DIAGNOSIS — N390 Urinary tract infection, site not specified: Secondary | ICD-10-CM | POA: Diagnosis not present

## 2019-06-21 DIAGNOSIS — I4891 Unspecified atrial fibrillation: Secondary | ICD-10-CM | POA: Diagnosis not present

## 2019-06-21 DIAGNOSIS — Z299 Encounter for prophylactic measures, unspecified: Secondary | ICD-10-CM | POA: Diagnosis not present

## 2019-06-21 DIAGNOSIS — R300 Dysuria: Secondary | ICD-10-CM | POA: Diagnosis not present

## 2019-07-03 ENCOUNTER — Telehealth: Payer: Self-pay | Admitting: *Deleted

## 2019-07-03 DIAGNOSIS — E785 Hyperlipidemia, unspecified: Secondary | ICD-10-CM

## 2019-07-03 DIAGNOSIS — Z79899 Other long term (current) drug therapy: Secondary | ICD-10-CM

## 2019-07-03 NOTE — Telephone Encounter (Signed)
-----   Message from Verta Ellen., NP sent at 07/01/2019  6:22 PM EDT ----- Total cholesterol 192, triglycerides 207, HDL 47, LDL 109. She is taking atorvastatin 10 mg now. For adults without CAD goal is less than 100 for LDL. Ask her if she is willing to increase the atorvastatin to 20 mg daily. If so, go ahead and send it in. Thanks

## 2019-07-05 MED ORDER — ATORVASTATIN CALCIUM 40 MG PO TABS: 40.0000 mg | ORAL_TABLET | Freq: Every day | ORAL | 3 refills | Status: DC

## 2019-07-05 NOTE — Telephone Encounter (Signed)
Patient informed. Reports she has been taking atorvastatin 20 mg daily. Medication profile update. Patient agrees to increasing atorvastatin 40 mg daily and will have fasting lipid/liver panel in 2 months. Request to have done at PCP office.

## 2019-07-05 NOTE — Telephone Encounter (Signed)
Okay thank you.  That sounds good

## 2019-07-23 DIAGNOSIS — I6529 Occlusion and stenosis of unspecified carotid artery: Secondary | ICD-10-CM | POA: Diagnosis not present

## 2019-07-23 DIAGNOSIS — Z471 Aftercare following joint replacement surgery: Secondary | ICD-10-CM | POA: Diagnosis not present

## 2019-07-23 DIAGNOSIS — M19011 Primary osteoarthritis, right shoulder: Secondary | ICD-10-CM | POA: Diagnosis not present

## 2019-07-23 DIAGNOSIS — Z96611 Presence of right artificial shoulder joint: Secondary | ICD-10-CM | POA: Diagnosis not present

## 2019-09-14 DIAGNOSIS — Z23 Encounter for immunization: Secondary | ICD-10-CM | POA: Diagnosis not present

## 2019-09-19 DIAGNOSIS — M720 Palmar fascial fibromatosis [Dupuytren]: Secondary | ICD-10-CM | POA: Diagnosis not present

## 2019-10-02 DIAGNOSIS — I4891 Unspecified atrial fibrillation: Secondary | ICD-10-CM | POA: Diagnosis not present

## 2019-10-02 DIAGNOSIS — I4892 Unspecified atrial flutter: Secondary | ICD-10-CM | POA: Diagnosis not present

## 2019-10-02 DIAGNOSIS — Z299 Encounter for prophylactic measures, unspecified: Secondary | ICD-10-CM | POA: Diagnosis not present

## 2019-10-02 DIAGNOSIS — E039 Hypothyroidism, unspecified: Secondary | ICD-10-CM | POA: Diagnosis not present

## 2019-10-02 DIAGNOSIS — I10 Essential (primary) hypertension: Secondary | ICD-10-CM | POA: Diagnosis not present

## 2019-10-10 DIAGNOSIS — Z23 Encounter for immunization: Secondary | ICD-10-CM | POA: Diagnosis not present

## 2019-10-22 DIAGNOSIS — I10 Essential (primary) hypertension: Secondary | ICD-10-CM | POA: Diagnosis not present

## 2019-10-22 DIAGNOSIS — Z299 Encounter for prophylactic measures, unspecified: Secondary | ICD-10-CM | POA: Diagnosis not present

## 2019-10-22 DIAGNOSIS — J069 Acute upper respiratory infection, unspecified: Secondary | ICD-10-CM | POA: Diagnosis not present

## 2019-10-22 DIAGNOSIS — Z789 Other specified health status: Secondary | ICD-10-CM | POA: Diagnosis not present

## 2019-11-02 DIAGNOSIS — R0602 Shortness of breath: Secondary | ICD-10-CM | POA: Diagnosis not present

## 2019-11-02 DIAGNOSIS — R059 Cough, unspecified: Secondary | ICD-10-CM | POA: Diagnosis not present

## 2019-11-02 DIAGNOSIS — I517 Cardiomegaly: Secondary | ICD-10-CM | POA: Diagnosis not present

## 2019-11-02 DIAGNOSIS — I10 Essential (primary) hypertension: Secondary | ICD-10-CM | POA: Diagnosis not present

## 2019-11-02 DIAGNOSIS — I4892 Unspecified atrial flutter: Secondary | ICD-10-CM | POA: Diagnosis not present

## 2019-11-02 DIAGNOSIS — I4891 Unspecified atrial fibrillation: Secondary | ICD-10-CM | POA: Diagnosis not present

## 2019-11-02 DIAGNOSIS — Z299 Encounter for prophylactic measures, unspecified: Secondary | ICD-10-CM | POA: Diagnosis not present

## 2019-11-02 DIAGNOSIS — J45909 Unspecified asthma, uncomplicated: Secondary | ICD-10-CM | POA: Diagnosis not present

## 2019-11-06 DIAGNOSIS — S83271D Complex tear of lateral meniscus, current injury, right knee, subsequent encounter: Secondary | ICD-10-CM | POA: Diagnosis not present

## 2019-11-06 DIAGNOSIS — M1711 Unilateral primary osteoarthritis, right knee: Secondary | ICD-10-CM | POA: Diagnosis not present

## 2019-11-09 ENCOUNTER — Other Ambulatory Visit: Payer: Self-pay | Admitting: Nurse Practitioner

## 2019-11-09 DIAGNOSIS — Z1231 Encounter for screening mammogram for malignant neoplasm of breast: Secondary | ICD-10-CM

## 2019-12-18 DIAGNOSIS — Z299 Encounter for prophylactic measures, unspecified: Secondary | ICD-10-CM | POA: Diagnosis not present

## 2019-12-18 DIAGNOSIS — R5383 Other fatigue: Secondary | ICD-10-CM | POA: Diagnosis not present

## 2019-12-18 DIAGNOSIS — Z79899 Other long term (current) drug therapy: Secondary | ICD-10-CM | POA: Diagnosis not present

## 2019-12-18 DIAGNOSIS — E039 Hypothyroidism, unspecified: Secondary | ICD-10-CM | POA: Diagnosis not present

## 2019-12-18 DIAGNOSIS — Z1339 Encounter for screening examination for other mental health and behavioral disorders: Secondary | ICD-10-CM | POA: Diagnosis not present

## 2019-12-18 DIAGNOSIS — Z1331 Encounter for screening for depression: Secondary | ICD-10-CM | POA: Diagnosis not present

## 2019-12-18 DIAGNOSIS — I10 Essential (primary) hypertension: Secondary | ICD-10-CM | POA: Diagnosis not present

## 2019-12-18 DIAGNOSIS — E7800 Pure hypercholesterolemia, unspecified: Secondary | ICD-10-CM | POA: Diagnosis not present

## 2019-12-18 DIAGNOSIS — Z0000 Encounter for general adult medical examination without abnormal findings: Secondary | ICD-10-CM | POA: Diagnosis not present

## 2019-12-18 DIAGNOSIS — Z7189 Other specified counseling: Secondary | ICD-10-CM | POA: Diagnosis not present

## 2019-12-18 DIAGNOSIS — Z6826 Body mass index (BMI) 26.0-26.9, adult: Secondary | ICD-10-CM | POA: Diagnosis not present

## 2019-12-19 ENCOUNTER — Other Ambulatory Visit: Payer: Self-pay

## 2019-12-19 ENCOUNTER — Ambulatory Visit
Admission: RE | Admit: 2019-12-19 | Discharge: 2019-12-19 | Disposition: A | Discharge status: Home / Self Care | Payer: Medicare Other | Source: Ambulatory Visit | Attending: Nurse Practitioner | Admitting: Nurse Practitioner

## 2019-12-19 DIAGNOSIS — Z1231 Encounter for screening mammogram for malignant neoplasm of breast: Secondary | ICD-10-CM | POA: Diagnosis not present

## 2019-12-20 ENCOUNTER — Ambulatory Visit: Payer: Medicare Other | Admitting: Cardiovascular Disease

## 2019-12-27 DIAGNOSIS — I10 Essential (primary) hypertension: Secondary | ICD-10-CM | POA: Diagnosis not present

## 2019-12-27 DIAGNOSIS — M2550 Pain in unspecified joint: Secondary | ICD-10-CM | POA: Diagnosis not present

## 2019-12-27 DIAGNOSIS — Z299 Encounter for prophylactic measures, unspecified: Secondary | ICD-10-CM | POA: Diagnosis not present

## 2019-12-27 DIAGNOSIS — I4891 Unspecified atrial fibrillation: Secondary | ICD-10-CM | POA: Diagnosis not present

## 2019-12-27 DIAGNOSIS — G4700 Insomnia, unspecified: Secondary | ICD-10-CM | POA: Diagnosis not present

## 2020-01-11 DIAGNOSIS — Z79899 Other long term (current) drug therapy: Secondary | ICD-10-CM | POA: Diagnosis not present

## 2020-01-11 DIAGNOSIS — Z7952 Long term (current) use of systemic steroids: Secondary | ICD-10-CM | POA: Diagnosis not present

## 2020-01-11 DIAGNOSIS — N959 Unspecified menopausal and perimenopausal disorder: Secondary | ICD-10-CM | POA: Diagnosis not present

## 2020-01-11 DIAGNOSIS — E2839 Other primary ovarian failure: Secondary | ICD-10-CM | POA: Diagnosis not present

## 2020-01-18 ENCOUNTER — Telehealth: Payer: Self-pay | Admitting: Orthopedic Surgery

## 2020-01-18 DIAGNOSIS — M25512 Pain in left shoulder: Secondary | ICD-10-CM | POA: Diagnosis not present

## 2020-01-18 DIAGNOSIS — M792 Neuralgia and neuritis, unspecified: Secondary | ICD-10-CM | POA: Diagnosis not present

## 2020-01-18 DIAGNOSIS — Z789 Other specified health status: Secondary | ICD-10-CM | POA: Diagnosis not present

## 2020-01-18 DIAGNOSIS — I10 Essential (primary) hypertension: Secondary | ICD-10-CM | POA: Diagnosis not present

## 2020-01-18 DIAGNOSIS — Z299 Encounter for prophylactic measures, unspecified: Secondary | ICD-10-CM | POA: Diagnosis not present

## 2020-01-18 DIAGNOSIS — R079 Chest pain, unspecified: Secondary | ICD-10-CM | POA: Diagnosis not present

## 2020-01-18 NOTE — Telephone Encounter (Signed)
Patient called to inquire about an immediate appointment for today, which I relayed we unfortunately do not, due to 1 provider only, and here only a short while today, and double-booked as well.  Due to nature of patient's urgent nature of problem - relays she is having intense shoulder/arm pain, radiating around that whole area. Relayed to contact primary care, or to go to nearest urgent care or emergency room if unable to reach primary care provider.

## 2020-01-24 ENCOUNTER — Encounter: Payer: Self-pay | Admitting: *Deleted

## 2020-01-24 ENCOUNTER — Encounter: Payer: Self-pay | Admitting: Cardiology

## 2020-01-24 ENCOUNTER — Ambulatory Visit (INDEPENDENT_AMBULATORY_CARE_PROVIDER_SITE_OTHER): Payer: Medicare Other | Admitting: Cardiology

## 2020-01-24 VITALS — BP 132/86 | HR 77 | Ht 65.0 in | Wt 158.0 lb

## 2020-01-24 DIAGNOSIS — I4821 Permanent atrial fibrillation: Secondary | ICD-10-CM | POA: Diagnosis not present

## 2020-01-24 DIAGNOSIS — E782 Mixed hyperlipidemia: Secondary | ICD-10-CM | POA: Diagnosis not present

## 2020-01-24 DIAGNOSIS — I10 Essential (primary) hypertension: Secondary | ICD-10-CM | POA: Diagnosis not present

## 2020-01-24 MED ORDER — RIVAROXABAN 20 MG PO TABS: ORAL_TABLET | ORAL | 6 refills | Status: DC

## 2020-01-24 NOTE — Patient Instructions (Signed)

## 2020-01-24 NOTE — Progress Notes (Signed)
Clinical Summary Melissa Faulkner is a 75 y.o.female former patient of Dr Bronson Ing, this is our first visit together.  1. Permanent afib - has had some palpitatiosn recently - overall tolerable symptoms - no bleeding on xarelto    2. HTN - compliant with meds - previously some low bp's to 90s/60s in mornings, pcp cut back bp meds    3. Hyperlipidemia - labs followed by pcp - compliant with statin     SH: good friends with [name] and his wife, also a patient of mine.   COVID vaccine x 3    Past Medical History:  Diagnosis Date  . Arthritis   . Asthma   . Heart murmur    noted in 71 -no echo ever done  . Hypertension   . Hypothyroidism   . Wears glasses      Allergies  Allergen Reactions  . Zestril [Lisinopril]     Lips swelled  . Zocor [Simvastatin]     Leg cramps     Current Outpatient Medications  Medication Sig Dispense Refill  . amLODipine (NORVASC) 5 MG tablet Take 5 mg by mouth daily.    Melissa Kitchen atorvastatin (LIPITOR) 40 MG tablet Take 1 tablet (40 mg total) by mouth daily. 90 tablet 3  . calcium carbonate (CALCIUM 600) 600 MG TABS tablet Take 600 mg by mouth 2 (two) times daily with a meal.    . chlorthalidone (HYGROTON) 50 MG tablet Take 50 mg by mouth daily.    Melissa Kitchen CRANBERRY PO Take 1 capsule by mouth daily.    Melissa Kitchen levothyroxine (SYNTHROID) 25 MCG tablet Take 25 mcg by mouth daily before breakfast.    . metoprolol succinate (TOPROL-XL) 25 MG 24 hr tablet Take 1 tablet (25 mg total) by mouth daily. 90 tablet 3  . Misc Natural Products (OSTEO BI-FLEX TRIPLE STRENGTH PO) Take 0.5 tablets by mouth daily.    . Multiple Vitamins-Minerals (MULTIVITAMIN WITH MINERALS) tablet Take 1 tablet by mouth daily.    . Omega 3-6-9 Fatty Acids (OMEGA 3-6-9 COMPLEX PO) Take 1 capsule by mouth daily.    . rivaroxaban (XARELTO) 20 MG TABS tablet TAKE (1) TABLET DAILY WITH SUPPER. 30 tablet 6  . SUPER B COMPLEX/C PO Take by mouth.    . theophylline (UNIPHYL) 600 MG 24  hr tablet Take 300 mg by mouth 2 (two) times a day.     . VENTOLIN HFA 108 (90 Base) MCG/ACT inhaler Inhale 2 puffs into the lungs every 4 (four) hours as needed.    . vitamin B-12 (CYANOCOBALAMIN) 500 MCG tablet Take 500 mcg by mouth daily.     No current facility-administered medications for this visit.     Past Surgical History:  Procedure Laterality Date  . ABDOMINAL HYSTERECTOMY  2002  . CARPAL TUNNEL RELEASE  2005   both rt/lt  . COLONOSCOPY    . GREAT TOE ARTHRODESIS, INTERPHALANGEAL JOINT  2014   right  . GREAT TOE ARTHRODESIS, [place] PROCEDURE  2015   left  . PLANTAR FASCIA SURGERY  2015   left  . TOTAL SHOULDER ARTHROPLASTY  2012   [facility]  . TUBAL LIGATION       Allergies  Allergen Reactions  . Zestril [Lisinopril]     Lips swelled  . Zocor [Simvastatin]     Leg cramps      Family History  Problem Relation Age of Onset  . Clotting disorder Mother        blood clot post  surgery  . Stomach cancer Father      Social History Ms. Faulkner reports that she has never smoked. She has never used smokeless tobacco. Melissa Faulkner reports current alcohol use.   Review of Systems CONSTITUTIONAL: No weight loss, fever, chills, weakness or fatigue.  HEENT: Eyes: No visual loss, blurred vision, double vision or yellow sclerae.No hearing loss, sneezing, congestion, runny nose or sore throat.  SKIN: No rash or itching.  CARDIOVASCULAR: per hpi RESPIRATORY: No shortness of breath, cough or sputum.  GASTROINTESTINAL: No anorexia, nausea, vomiting or diarrhea. No abdominal pain or blood.  GENITOURINARY: No burning on urination, no polyuria NEUROLOGICAL: No headache, dizziness, syncope, paralysis, ataxia, numbness or tingling in the extremities. No change in bowel or bladder control.  MUSCULOSKELETAL: No muscle, back pain, joint pain or stiffness.  LYMPHATICS: No enlarged nodes. No history of splenectomy.  PSYCHIATRIC: No history of depression or anxiety.   ENDOCRINOLOGIC: No reports of sweating, cold or heat intolerance. No polyuria or polydipsia.  Melissa Kitchen   Physical Examination Today's Vitals   01/24/20 1042  BP: 132/86  Pulse: 77  SpO2: 97%  Weight: 158 lb (71.7 kg)  Height: 5\' 5"  (1.651 m)   Body mass index is 26.29 kg/m.  Gen: resting comfortably, no acute distress HEENT: no scleral icterus, pupils equal round and reactive, no palptable cervical adenopathy,  CV: irreg, no m/rg/ no jvd Resp: Clear to auscultation bilaterally GI: abdomen is soft, non-tender, non-distended, normal bowel sounds, no hepatosplenomegaly MSK: extremities are warm, no edema.  Skin: warm, no rash Neuro:  no focal deficits Psych: appropriate affect     Assessment and Plan  1. Permanent afib - mild symptoms at times that are tolerable, monitor at this time. If progression would titrate up toprol - continue xarelto  2. HTN - essentially at goal, continue current meds  3. Hyperlipidemia - request pcp labs, continue statin. From my chart print out LDL 119 which is ok for her. Discussed dietary changes to lower TGs which were mildly elevated.       Arnoldo Lenis, M.D

## 2020-02-07 DIAGNOSIS — I4891 Unspecified atrial fibrillation: Secondary | ICD-10-CM | POA: Diagnosis not present

## 2020-02-07 DIAGNOSIS — E039 Hypothyroidism, unspecified: Secondary | ICD-10-CM | POA: Diagnosis not present

## 2020-02-07 DIAGNOSIS — I4892 Unspecified atrial flutter: Secondary | ICD-10-CM | POA: Diagnosis not present

## 2020-02-07 DIAGNOSIS — I10 Essential (primary) hypertension: Secondary | ICD-10-CM | POA: Diagnosis not present

## 2020-02-07 DIAGNOSIS — Z299 Encounter for prophylactic measures, unspecified: Secondary | ICD-10-CM | POA: Diagnosis not present

## 2020-02-20 DIAGNOSIS — I781 Nevus, non-neoplastic: Secondary | ICD-10-CM | POA: Diagnosis not present

## 2020-02-20 DIAGNOSIS — L570 Actinic keratosis: Secondary | ICD-10-CM | POA: Diagnosis not present

## 2020-02-20 DIAGNOSIS — L814 Other melanin hyperpigmentation: Secondary | ICD-10-CM | POA: Diagnosis not present

## 2020-02-20 DIAGNOSIS — D1801 Hemangioma of skin and subcutaneous tissue: Secondary | ICD-10-CM | POA: Diagnosis not present

## 2020-02-25 DIAGNOSIS — M1711 Unilateral primary osteoarthritis, right knee: Secondary | ICD-10-CM | POA: Diagnosis not present

## 2020-03-24 ENCOUNTER — Other Ambulatory Visit: Payer: Self-pay | Admitting: Orthopedic Surgery

## 2020-03-24 DIAGNOSIS — M19012 Primary osteoarthritis, left shoulder: Secondary | ICD-10-CM | POA: Diagnosis not present

## 2020-03-24 DIAGNOSIS — M25512 Pain in left shoulder: Secondary | ICD-10-CM

## 2020-03-24 DIAGNOSIS — M542 Cervicalgia: Secondary | ICD-10-CM | POA: Diagnosis not present

## 2020-03-26 ENCOUNTER — Telehealth: Payer: Self-pay | Admitting: *Deleted

## 2020-03-26 DIAGNOSIS — I10 Essential (primary) hypertension: Secondary | ICD-10-CM | POA: Insufficient documentation

## 2020-03-26 DIAGNOSIS — I4891 Unspecified atrial fibrillation: Secondary | ICD-10-CM | POA: Insufficient documentation

## 2020-03-26 NOTE — Telephone Encounter (Signed)
   [facility] Pre-operative Risk Assessment    HEARTCARE STAFF: - Please ensure there is not already an duplicate clearance open for this procedure. - Under Visit Info/Reason for Call, type in Other and utilize the format Clearance MM/DD/YY or Clearance TBD. Do not use dashes or single digits. - If request is for dental extraction, please clarify the # of teeth to be extracted.  Request for surgical clearance:  1. What type of surgery is being performed? Left Total Shoulder Replacement  2. When is this surgery scheduled? TBD  3. What type of clearance is required (medical clearance vs. Pharmacy clearance to hold med vs. Both)? Both  4. Are there any medications that need to be held prior to surgery and how long? Xarelto  5. Practice name and name of physician performing surgery? [facility]/Dr. Marchia Bond  6. What is the office phone number? [phone]   7.   What is the office fax number? [phone]  8.   Anesthesia type (None, local, MAC, general) ? general   Marlou Sa 03/26/2020, 8:18 AM  _________________________________________________________________   Please send most recent notes, labs, EKG or special studies with form to the attention of Sherri

## 2020-03-26 NOTE — Telephone Encounter (Signed)
° °  Primary Cardiologist: Carlyle Dolly, MD  Chart reviewed as part of pre-operative protocol coverage. Given past medical history and time since last visit, based on ACC/AHA guidelines, Melissa Faulkner would be at acceptable risk for the planned procedure without further cardiovascular testing.   Patient with diagnosis of afib on Xarelto for anticoagulation. Problem list is not accurate/updated, afib has been added.  Procedure: left total shoulder replacement Date of procedure: TBD  CHA2DS2-VASc Score = 4  This indicates a 4.8% annual risk of stroke. The patient's score is based upon: CHF History: No HTN History: Yes Diabetes History: No Stroke History: No Vascular Disease History: No Age Score: 2 Gender Score: 1  CrCl 39mL/min Platelet count 220K  Per office protocol, patient can hold Xarelto for 3 days prior to procedure.  I will route this recommendation to the requesting party via Epic fax function and remove from pre-op pool.  Please call with questions.  [name]. [name] NP-C    03/26/2020, [address] Office [phone] Fax [phone]

## 2020-03-26 NOTE — Telephone Encounter (Signed)
Patient with diagnosis of afib on Xarelto for anticoagulation. Problem list is not accurate/updated, afib has been added.  Procedure: left total shoulder replacement Date of procedure: TBD  CHA2DS2-VASc Score = 4  This indicates a 4.8% annual risk of stroke. The patient's score is based upon: CHF History: No HTN History: Yes Diabetes History: No Stroke History: No Vascular Disease History: No Age Score: 2 Gender Score: 1  CrCl 2mL/min Platelet count 220K  Per office protocol, patient can hold Xarelto for 3 days prior to procedure.

## 2020-04-02 DIAGNOSIS — I4891 Unspecified atrial fibrillation: Secondary | ICD-10-CM | POA: Diagnosis not present

## 2020-04-02 DIAGNOSIS — Z299 Encounter for prophylactic measures, unspecified: Secondary | ICD-10-CM | POA: Diagnosis not present

## 2020-04-02 DIAGNOSIS — I4892 Unspecified atrial flutter: Secondary | ICD-10-CM | POA: Diagnosis not present

## 2020-04-02 DIAGNOSIS — M25512 Pain in left shoulder: Secondary | ICD-10-CM | POA: Diagnosis not present

## 2020-04-02 DIAGNOSIS — I10 Essential (primary) hypertension: Secondary | ICD-10-CM | POA: Diagnosis not present

## 2020-04-07 ENCOUNTER — Ambulatory Visit
Admission: RE | Admit: 2020-04-07 | Discharge: 2020-04-07 | Disposition: A | Discharge status: Home / Self Care | Payer: Medicare Other | Source: Ambulatory Visit | Attending: Orthopedic Surgery | Admitting: Orthopedic Surgery

## 2020-04-07 DIAGNOSIS — M19012 Primary osteoarthritis, left shoulder: Secondary | ICD-10-CM | POA: Diagnosis not present

## 2020-04-07 DIAGNOSIS — Z01818 Encounter for other preprocedural examination: Secondary | ICD-10-CM | POA: Diagnosis not present

## 2020-04-07 DIAGNOSIS — M25412 Effusion, left shoulder: Secondary | ICD-10-CM | POA: Diagnosis not present

## 2020-04-07 DIAGNOSIS — M25512 Pain in left shoulder: Secondary | ICD-10-CM

## 2020-04-07 DIAGNOSIS — Z96642 Presence of left artificial hip joint: Secondary | ICD-10-CM | POA: Diagnosis not present

## 2020-04-13 NOTE — Progress Notes (Signed)
Cardiology Office Note  Date: 04/14/2020   ID: Melissa Faulkner, DOB 1945-06-15, MRN [id number]  PCP:  Glenda Chroman, MD  Cardiologist:  Carlyle Dolly, MD Electrophysiologist:  None   Chief Complaint: Pre op clearance  History of Present Illness: Melissa Faulkner is a 75 y.o. female with a history of atrial fibrillation , HTN.    Last seen by Dr Harl Bowie 01/24/2020. Had some recent palpitations but tolerating symptoms. Plans to monitor. Would titrate Toprol XL if further symptoms . No bleeding on Xarelto. To continue Xarelto.  Had some previous low BP 90/60's. PCP cut back on BP medications.  BP at goal. To continue current medications. Compliant with statins. Recent LDL 119. Lab followed by PCP.  She is here today for pre-op clearance for pending left total shoulder replacement Dr Marchia Bond. She denies any recent acute illnesses or hospitalizations.  Denies any anginal or exertional symptoms, palpitations or arrhythmias.  EKG today shows atrial fibrillation with rate controlled at 75.  Blood pressure 132/84.  Denies any CVA or TIA-like symptoms, orthostatic symptoms, PND, orthopnea, bleeding.  Denies any claudication-like symptoms but does have hip and knee pain from arthritis.  Denies any DVT or PE-like symptoms, or lower extremity edema.  Past Medical History:  Diagnosis Date  . Arthritis   . Asthma   . Heart murmur    noted in 71 -no echo ever done  . Hypertension   . Hypothyroidism   . Wears glasses     Past Surgical History:  Procedure Laterality Date  . ABDOMINAL HYSTERECTOMY  2002  . CARPAL TUNNEL RELEASE  2005   both rt/lt  . COLONOSCOPY    . GREAT TOE ARTHRODESIS, INTERPHALANGEAL JOINT  2014   right  . GREAT TOE ARTHRODESIS, [place] PROCEDURE  2015   left  . PLANTAR FASCIA SURGERY  2015   left  . TOTAL SHOULDER ARTHROPLASTY  2012   [facility]  . TUBAL LIGATION      Current Outpatient Medications  Medication Sig Dispense Refill  . amLODipine (NORVASC) 5  MG tablet Take 5 mg by mouth daily.    Marland Kitchen aspirin EC 81 MG tablet Take 81 mg by mouth daily. Swallow whole.    Marland Kitchen atorvastatin (LIPITOR) 40 MG tablet Take 1 tablet (40 mg total) by mouth daily. 90 tablet 3  . calcium carbonate (OS-CAL) 600 MG TABS tablet Take 600 mg by mouth 2 (two) times daily with a meal.    . chlorthalidone (HYGROTON) 25 MG tablet Take 12.5 mg by mouth daily.    Marland Kitchen CRANBERRY PO Take 1 capsule by mouth daily.    Marland Kitchen levothyroxine (SYNTHROID) 50 MCG tablet Take 50 mcg by mouth daily before breakfast.    . metoprolol succinate (TOPROL-XL) 25 MG 24 hr tablet Take 1 tablet (25 mg total) by mouth daily. 90 tablet 3  . Multiple Vitamins-Minerals (MULTIVITAMIN WITH MINERALS) tablet Take 1 tablet by mouth daily.    . Omega 3-6-9 Fatty Acids (OMEGA 3-6-9 COMPLEX PO) Take 1 capsule by mouth daily.    . rivaroxaban (XARELTO) 20 MG TABS tablet TAKE (1) TABLET DAILY WITH SUPPER. 30 tablet 6  . theophylline (UNIPHYL) 600 MG 24 hr tablet Take 300 mg by mouth 2 (two) times a day.     . VENTOLIN HFA 108 (90 Base) MCG/ACT inhaler Inhale 2 puffs into the lungs every 4 (four) hours as needed.     No current facility-administered medications for this visit.   Allergies:  Zestril [  lisinopril] and Zocor [simvastatin]   Social History: The patient  reports that she has never smoked. She has never used smokeless tobacco. She reports current alcohol use. She reports that she does not use drugs.   Family History: The patient's family history includes Clotting disorder in her mother; Stomach cancer in her father.   ROS:  Please see the history of present illness. Otherwise, complete review of systems is positive for none.  All other systems are reviewed and negative.   Physical Exam: VS:  BP 132/84   Pulse 74   Ht 5\' 5"  (1.651 m)   Wt 160 lb 9.6 oz (72.8 kg)   SpO2 97%   BMI 26.73 kg/m , BMI Body mass index is 26.73 kg/m.  Wt Readings from Last 3 Encounters:  04/14/20 160 lb 9.6 oz (72.8 kg)   01/24/20 158 lb (71.7 kg)  06/20/19 157 lb 6.4 oz (71.4 kg)    General: Patient appears comfortable at rest. Neck: Supple, no elevated JVP or carotid bruits, no thyromegaly. Lungs: Clear to auscultation, nonlabored breathing at rest. Cardiac: Regular rate and rhythm, no S3 or significant systolic murmur, no pericardial rub. Extremities: No pitting edema, distal pulses 2+. Skin: Warm and dry. Musculoskeletal: No kyphosis. Neuropsychiatric: Alert and oriented x3, affect grossly appropriate.  ECG:  An ECG dated 04/14/2020 was personally reviewed today and demonstrated:  Atrial fibrillation with competing junctional pacemaker rate of 75  Recent Labwork: No results found for requested labs within last 8760 hours.  No results found for: CHOL, TRIG, HDL, CHOLHDL, VLDL, LDLCALC, LDLDIRECT  Other Studies Reviewed Today:   Assessment and Plan:  1. Preoperative clearance   2. Atrial fibrillation, unspecified type ([place])   3. Essential hypertension   4. Mixed hyperlipidemia    1. Preoperative clearance Patient has pending left total shoulder replacement to be performed by Dr. Marchia Bond. Her revised cardiac risk index score is 0 placing the patient at a perioperative risk of major cardiac event at 0.4%.  Her Duke activity status index score is 42.7 giving the patient a functional capacity and METS at 7.99.  From a cardiac standpoint patient is cleared to undergo total left shoulder replacement under general anesthesia.  Per office protocol.  Patient can hold Xarelto for 3 days prior to procedure.  2. Atrial fibrillation, unspecified type (HCC) Atrial fibrillation rate is controlled today.  EKG shows atrial fibrillation with competing junctional pacemaker rate of 75.  Continue Toprol-XL 25 mg daily.  Continue Xarelto 20 mg p.o. daily.  3. Essential hypertension BP today 132/84.  Continue amlodipine 5 mg daily.  Continue chlorthalidone 12.5 mg p.o. daily.  Continue Toprol-XL 25 mg p.o.  daily.  4. Mixed hyperlipidemia Continue atorvastatin 40 mg daily p.o.  Medication Adjustments/Labs and Tests Ordered: Current medicines are reviewed at length with the patient today.  Concerns regarding medicines are outlined above.   Disposition: Follow-up with Dr. Harl Bowie or APP 6 months  Signed, Levell July, NP 04/14/2020 3:32 PM    [facility] [place], [place], [place] [address] Phone: [phone]; Fax: [phone]

## 2020-04-14 ENCOUNTER — Ambulatory Visit (INDEPENDENT_AMBULATORY_CARE_PROVIDER_SITE_OTHER): Payer: Medicare Other | Admitting: Family Medicine

## 2020-04-14 ENCOUNTER — Encounter: Payer: Self-pay | Admitting: Family Medicine

## 2020-04-14 VITALS — BP 132/84 | HR 74 | Ht 65.0 in | Wt 160.6 lb

## 2020-04-14 DIAGNOSIS — E782 Mixed hyperlipidemia: Secondary | ICD-10-CM | POA: Diagnosis not present

## 2020-04-14 DIAGNOSIS — I4891 Unspecified atrial fibrillation: Secondary | ICD-10-CM | POA: Diagnosis not present

## 2020-04-14 DIAGNOSIS — I10 Essential (primary) hypertension: Secondary | ICD-10-CM | POA: Diagnosis not present

## 2020-04-14 DIAGNOSIS — Z01818 Encounter for other preprocedural examination: Secondary | ICD-10-CM

## 2020-04-14 NOTE — Patient Instructions (Signed)
Medication Instructions:  Continue all current medications.   Labwork: none  Testing/Procedures: none  Follow-Up: 6 months   Any Other Special Instructions Will Be Listed Below (If Applicable).   If you need a refill on your cardiac medications before your next appointment, please call your pharmacy.  

## 2020-04-15 NOTE — Addendum Note (Signed)
Addended by: Merlene Laughter on: 04/15/2020 09:38 AM   Modules accepted: Orders

## 2020-04-17 NOTE — Patient Instructions (Addendum)
DUE TO COVID-19 ONLY ONE VISITOR IS ALLOWED TO COME WITH YOU AND STAY IN THE WAITING ROOM ONLY DURING PRE OP AND PROCEDURE.   **NO VISITORS ARE ALLOWED IN THE SHORT STAY AREA OR RECOVERY ROOM!!**    COVID SWAB TESTING MUST BE COMPLETED ON:  Friday, 05-02-20 @ 1:05 PM   [address] [place], [place] [address]  (Must self quarantine after testing. Follow instructions on handout.)   Your procedure is scheduled on: Tuesday, 05-06-20   Report to [facility] Main  Entrance    Report to admitting at 11:00 AM   Call this number if you have problems the morning of surgery [phone]   Do not eat food :After Midnight.   May have liquids until 10:30 AM  day of surgery  CLEAR LIQUID DIET  Foods Allowed                                                                     Foods Excluded  Water, Black Coffee and tea, regular and decaf           liquids that you cannot  Plain Jell-O in any flavor  (No red)                                  see through such as: Fruit ices (not with fruit pulp)                                      milk, soups, orange juice              Iced Popsicles (No red)                                      All solid food                                   Apple juices Sports drinks like Gatorade (No red) Lightly seasoned clear broth or consume(fat free) Sugar, honey syrup     Complete one Ensure drink the morning of surgery at  10:30 AM the day of surgery.     1. The day of surgery:  ? Drink ONE (1) Pre-Surgery Clear Ensure or G2 by am the morning of surgery. Drink in one sitting. Do not sip.  ? This drink was given to you during your hospital  pre-op appointment visit. ? Nothing else to drink after completing the  Pre-Surgery Clear Ensure or G2.          If you have questions, please contact your surgeon's office.     Oral Hygiene is also important to reduce your risk of infection.                                    Remember - BRUSH YOUR TEETH THE MORNING  OF SURGERY WITH YOUR REGULAR TOOTHPASTE  Do NOT smoke after Midnight   Take these medicines the morning of surgery with A SIP OF WATER:  Amlodipine, Atorvastatin, Levothyroxine, Metoprolol, Theophylline,  Ventolin inhaler (bring with you day of surgery)                               You may not have any metal on your body including hair pins, jewelry, and body piercings             Do not wear make-up, lotions, powders, perfumes/cologne, or deodorant             Do not wear nail polish.  Do not shave  48 hours prior to surgery.    Do not bring valuables to the hospital. [place].   Contacts, dentures or bridgework may not be worn into surgery.   Patients discharged the day of surgery will not be allowed to drive home.   Special Instructions: Bring a copy of your healthcare power of attorney and living will documents  the day of surgery if you haven't  scanned them in before.              Please read over the following fact sheets you were given: IF YOU HAVE QUESTIONS ABOUT YOUR PRE OP INSTRUCTIONS PLEASE CALL  [place]- Preparing for Total Shoulder Arthroplasty    Before surgery, you can play an important role. Because skin is not sterile, your skin needs to be as free of germs as possible. You can reduce the number of germs on your skin by using the following products. . Benzoyl Peroxide Gel o Reduces the number of germs present on the skin o Applied twice a day to shoulder area starting two days before surgery    ==================================================================  Please follow these instructions carefully:  BENZOYL PEROXIDE 5% GEL  Please do not use if you have an allergy to benzoyl peroxide.   If your skin becomes reddened/irritated stop using the benzoyl peroxide.  Starting two days before surgery, apply as follows: 1. Apply benzoyl peroxide in the morning and at night. Apply after taking a shower.  If you are not taking a shower clean entire shoulder front, back, and side along with the armpit with a clean wet washcloth.  2. Place a quarter-sized dollop on your shoulder and rub in thoroughly, making sure to cover the front, back, and side of your shoulder, along with the armpit.   2 days before ____ AM   ____ PM              1 day before ____ AM   ____ PM                         3. Do this twice a day for two days.  (Last application is the night before surgery, AFTER using the CHG soap as described below).  4. Do NOT apply benzoyl peroxide gel on the day of surgery.    [facility] - Preparing for Surgery Before surgery, you can play an important role.  Because skin is not sterile, your skin needs to be as free of germs as possible.  You can reduce the number of germs on your skin by washing with CHG (chlorahexidine gluconate) soap before surgery.  CHG is an antiseptic cleaner which kills germs and bonds with the skin to  continue killing germs even after washing. Please DO NOT use if you have an allergy to CHG or antibacterial soaps.  If your skin becomes reddened/irritated stop using the CHG and inform your nurse when you arrive at Short Stay. Do not shave (including legs and underarms) for at least 48 hours prior to the first CHG shower.  You may shave your face/neck.  Please follow these instructions carefully:  1.  Shower with CHG Soap the night before surgery and the  morning of surgery.  2.  If you choose to wash your hair, wash your hair first as usual with your normal  shampoo.  3.  After you shampoo, rinse your hair and body thoroughly to remove the shampoo.                             4.  Use CHG as you would any other liquid soap.  You can apply chg directly to the skin and wash.  Gently with a scrungie or clean washcloth.  5.  Apply the CHG Soap to your body ONLY FROM THE NECK DOWN.   Do   not use on face/ open                           Wound or open sores. Avoid contact with  eyes, ears mouth and   genitals (private parts).                       Wash face,  Genitals (private parts) with your normal soap.             6.  Wash thoroughly, paying special attention to the area where your    surgery  will be performed.  7.  Thoroughly rinse your body with warm water from the neck down.  8.  DO NOT shower/wash with your normal soap after using and rinsing off the CHG Soap.                9.  Pat yourself dry with a clean towel.            10.  Wear clean pajamas.            11.  Place clean sheets on your bed the night of your first shower and do not  sleep with pets. Day of Surgery : Do not apply any lotions/deodorants the morning of surgery.  Please wear clean clothes to the hospital/surgery center.  FAILURE TO FOLLOW THESE INSTRUCTIONS MAY RESULT IN THE CANCELLATION OF YOUR SURGERY  PATIENT SIGNATURE_________________________________  NURSE SIGNATURE__________________________________  ________________________________________________________________________

## 2020-04-17 NOTE — Progress Notes (Addendum)
COVID Vaccine Completed:  x3 Date COVID Vaccine completed:  01-25-19 & 02-15-19 Has received booster: 10-10-19 COVID vaccine manufacturer: [place]     Date of COVID positive in last 90 days: N/A  PCP - Jerene Bears, MD Cardiologist - Carlyle Dolly, MD  Cardiac clearance in note dated 04-14-20 by Levell July, NP  Chest x-ray - 11-02-19 [facility] in [place].  Result Care Everywhere EKG - 04-14-20 Epic Stress Test - 15+ years ago ECHO - 2017 Epic Cardiac Cath - N/A Pacemaker/ICD device last checked: Spinal Cord Stimulator:  Sleep Study - N/A CPAP -   Fasting Blood Sugar - N/A Checks Blood Sugar _____ times a day  Blood Thinner Instructions: Xarelto 20 mg.  Per clearance can hold for 3 days (patient aware)  Aspirin Instructions:  ASA 81 mg  Last Dose:  04-27-20  Activity level:  Can go up a flight of stairs and perform activities of daily living without stopping and without symptoms of chest pain or shortness of breath.     Anesthesia review: Afib, HTN.  Murmur  Patient denies shortness of breath, fever, cough and chest pain at PAT appointment   Patient verbalized understanding of instructions that were given to them at the PAT appointment. Patient was also instructed that they will need to review over the PAT instructions again at home before surgery.

## 2020-04-21 DIAGNOSIS — M19012 Primary osteoarthritis, left shoulder: Secondary | ICD-10-CM | POA: Diagnosis not present

## 2020-04-22 DIAGNOSIS — M19012 Primary osteoarthritis, left shoulder: Secondary | ICD-10-CM | POA: Diagnosis present

## 2020-04-29 ENCOUNTER — Encounter (HOSPITAL_COMMUNITY)
Admission: RE | Admit: 2020-04-29 | Discharge: 2020-04-29 | Disposition: A | Discharge status: Home / Self Care | Payer: Medicare Other | Source: Ambulatory Visit | Attending: Orthopedic Surgery | Admitting: Orthopedic Surgery

## 2020-04-29 ENCOUNTER — Other Ambulatory Visit: Payer: Self-pay

## 2020-04-29 ENCOUNTER — Encounter (HOSPITAL_COMMUNITY): Payer: Self-pay

## 2020-04-29 DIAGNOSIS — Z01812 Encounter for preprocedural laboratory examination: Secondary | ICD-10-CM | POA: Insufficient documentation

## 2020-04-29 HISTORY — DX: Hyperlipidemia, unspecified: E78.5

## 2020-04-29 HISTORY — DX: Unspecified malignant neoplasm of skin, unspecified: C44.90

## 2020-04-29 HISTORY — DX: Unspecified atrial fibrillation: I48.91

## 2020-04-29 LAB — BASIC METABOLIC PANEL
Anion gap: 12 (ref 5–15)
BUN: 25 mg/dL — ABNORMAL HIGH (ref 8–23)
CO2: 28 mmol/L (ref 22–32)
Calcium: 10.5 mg/dL — ABNORMAL HIGH (ref 8.9–10.3)
Chloride: 103 mmol/L (ref 98–111)
Creatinine, Ser: 0.9 mg/dL (ref 0.44–1.00)
GFR, Estimated: >60 mL/min (ref >60)
Glucose, Bld: 99 mg/dL (ref 70–99)
Potassium: 3.8 mmol/L (ref 3.5–5.1)
Sodium: 143 mmol/L (ref 135–145)

## 2020-04-29 LAB — CBC
HCT: 42.6 % (ref 36.0–46.0)
Hemoglobin: 14.4 g/dL (ref 12.0–15.0)
MCH: 30.5 pg (ref 26.0–34.0)
MCHC: 33.8 g/dL (ref 30.0–36.0)
MCV: 90.3 fL (ref 80.0–100.0)
Platelets: 207 10*3/uL (ref 150–400)
RBC: 4.72 MIL/uL (ref 3.87–5.11)
RDW: 13.4 % (ref 11.5–15.5)
WBC: 4.9 10*3/uL (ref 4.0–10.5)
nRBC: 0 % (ref 0.0–0.2)

## 2020-04-29 LAB — SURGICAL PCR SCREEN
MRSA, PCR: NEGATIVE
Staphylococcus aureus: NEGATIVE

## 2020-04-30 NOTE — Anesthesia Preprocedure Evaluation (Addendum)
Anesthesia Evaluation    Airway Mallampati: II  TM Distance: >3 FB Neck ROM: Full    Dental no notable dental hx.    Pulmonary asthma ,    Pulmonary exam normal breath sounds clear to auscultation       Cardiovascular hypertension, Pt. on medications Normal cardiovascular exam Rhythm:Regular Rate:Normal     Neuro/Psych    GI/Hepatic   Endo/Other  Hypothyroidism   Renal/GU      Musculoskeletal  (+) Arthritis , Osteoarthritis,    Abdominal   Peds  Hematology   Anesthesia Other Findings   Reproductive/Obstetrics                            Anesthesia Physical Anesthesia Plan  ASA: II  Anesthesia Plan: General   Post-op Pain Management:  Regional for Post-op pain   Induction: Intravenous  PONV Risk Score and Plan: 3 and Ondansetron, Dexamethasone, Midazolam and Treatment may vary due to age or medical condition  Airway Management Planned: Oral ETT  Additional Equipment:   Intra-op Plan:   Post-operative Plan: Extubation in OR  Informed Consent: I have reviewed the patients History and Physical, chart, labs and discussed the procedure including the risks, benefits and alternatives for the proposed anesthesia with the patient or authorized representative who has indicated his/her understanding and acceptance.     Dental advisory given  Plan Discussed with: CRNA  Anesthesia Plan Comments: (Per cardiology note 04/14/20, "Her revised cardiac risk index score is 0 placing the patient at a perioperative risk of major cardiac event at 0.4%.  Her Duke activity status index score is 42.7 giving the patient a functional capacity and METS at 7.99.  From a cardiac standpoint patient is cleared to undergo total left shoulder replacement under general anesthesia.  Per office protocol.  Patient can hold Xarelto for 3 days prior to procedure.")       Anesthesia Quick Evaluation

## 2020-05-02 ENCOUNTER — Other Ambulatory Visit (HOSPITAL_COMMUNITY)
Admission: RE | Admit: 2020-05-02 | Discharge: 2020-05-02 | Disposition: A | Discharge status: Home / Self Care | Payer: Medicare Other | Source: Ambulatory Visit | Attending: Orthopedic Surgery | Admitting: Orthopedic Surgery

## 2020-05-02 DIAGNOSIS — Z01812 Encounter for preprocedural laboratory examination: Secondary | ICD-10-CM | POA: Insufficient documentation

## 2020-05-02 DIAGNOSIS — Z20822 Contact with and (suspected) exposure to covid-19: Secondary | ICD-10-CM | POA: Diagnosis not present

## 2020-05-03 LAB — SARS CORONAVIRUS 2 (TAT 6-24 HRS): SARS Coronavirus 2: NEGATIVE

## 2020-05-06 ENCOUNTER — Encounter (HOSPITAL_COMMUNITY)
Admission: RE | Disposition: A | Discharge status: Home / Self Care | Payer: Self-pay | Source: Other Acute Inpatient Hospital | Attending: Orthopedic Surgery

## 2020-05-06 ENCOUNTER — Ambulatory Visit (HOSPITAL_COMMUNITY): Payer: Medicare Other | Admitting: Certified Registered"

## 2020-05-06 ENCOUNTER — Ambulatory Visit (HOSPITAL_COMMUNITY): Payer: Medicare Other | Admitting: Physician Assistant

## 2020-05-06 ENCOUNTER — Ambulatory Visit (HOSPITAL_COMMUNITY): Payer: Medicare Other

## 2020-05-06 ENCOUNTER — Ambulatory Visit (HOSPITAL_COMMUNITY)
Admission: RE | Admit: 2020-05-06 | Discharge: 2020-05-06 | Disposition: A | Discharge status: Home / Self Care | Payer: Medicare Other | Source: Other Acute Inpatient Hospital | Attending: Orthopedic Surgery | Admitting: Orthopedic Surgery

## 2020-05-06 ENCOUNTER — Encounter (HOSPITAL_COMMUNITY): Payer: Self-pay | Admitting: Orthopedic Surgery

## 2020-05-06 DIAGNOSIS — I10 Essential (primary) hypertension: Secondary | ICD-10-CM | POA: Insufficient documentation

## 2020-05-06 DIAGNOSIS — Z7951 Long term (current) use of inhaled steroids: Secondary | ICD-10-CM | POA: Insufficient documentation

## 2020-05-06 DIAGNOSIS — Z888 Allergy status to other drugs, medicaments and biological substances status: Secondary | ICD-10-CM | POA: Insufficient documentation

## 2020-05-06 DIAGNOSIS — J45909 Unspecified asthma, uncomplicated: Secondary | ICD-10-CM | POA: Diagnosis not present

## 2020-05-06 DIAGNOSIS — E039 Hypothyroidism, unspecified: Secondary | ICD-10-CM | POA: Diagnosis not present

## 2020-05-06 DIAGNOSIS — M19012 Primary osteoarthritis, left shoulder: Secondary | ICD-10-CM | POA: Diagnosis not present

## 2020-05-06 DIAGNOSIS — Z471 Aftercare following joint replacement surgery: Secondary | ICD-10-CM | POA: Diagnosis not present

## 2020-05-06 DIAGNOSIS — Z79899 Other long term (current) drug therapy: Secondary | ICD-10-CM | POA: Diagnosis not present

## 2020-05-06 DIAGNOSIS — Z96612 Presence of left artificial shoulder joint: Secondary | ICD-10-CM | POA: Diagnosis not present

## 2020-05-06 DIAGNOSIS — I4891 Unspecified atrial fibrillation: Secondary | ICD-10-CM | POA: Insufficient documentation

## 2020-05-06 DIAGNOSIS — Z79890 Hormone replacement therapy (postmenopausal): Secondary | ICD-10-CM | POA: Insufficient documentation

## 2020-05-06 DIAGNOSIS — G8918 Other acute postprocedural pain: Secondary | ICD-10-CM | POA: Diagnosis not present

## 2020-05-06 HISTORY — PX: TOTAL SHOULDER ARTHROPLASTY: SHX126

## 2020-05-06 SURGERY — ARTHROPLASTY, SHOULDER, TOTAL
Anesthesia: General | Site: Shoulder | Laterality: Left

## 2020-05-06 MED ORDER — ROCURONIUM BROMIDE 10 MG/ML (PF) SYRINGE
PREFILLED_SYRINGE | INTRAVENOUS | Status: AC
  Filled 2020-05-06: qty 10

## 2020-05-06 MED ORDER — PHENYLEPHRINE HCL-NACL 10-0.9 MG/250ML-% IV SOLN
INTRAVENOUS | Status: DC | PRN
  Administered 2020-05-06: 13 ug/min via INTRAVENOUS

## 2020-05-06 MED ORDER — OXYCODONE HCL 5 MG/5ML PO SOLN
5.0000 mg | Freq: Once | ORAL | Status: DC | PRN
Start: 2020-05-06 — End: 2020-05-06

## 2020-05-06 MED ORDER — DEXMEDETOMIDINE (PRECEDEX) IN NS 20 MCG/5ML (4 MCG/ML) IV SYRINGE
PREFILLED_SYRINGE | INTRAVENOUS | Status: AC
  Filled 2020-05-06: qty 5

## 2020-05-06 MED ORDER — ACETAMINOPHEN 500 MG PO TABS
1000.0000 mg | ORAL_TABLET | Freq: Once | ORAL | Status: AC
  Administered 2020-05-06: 1000 mg via ORAL

## 2020-05-06 MED ORDER — AMISULPRIDE (ANTIEMETIC) 5 MG/2ML IV SOLN: 10.0000 mg | Freq: Once | INTRAVENOUS | Status: DC | PRN

## 2020-05-06 MED ORDER — ORAL CARE MOUTH RINSE: 15.0000 mL | Freq: Once | OROMUCOSAL | Status: AC

## 2020-05-06 MED ORDER — HYDROCODONE-ACETAMINOPHEN 10-325 MG PO TABS: 1.0000 | ORAL_TABLET | Freq: Four times a day (QID) | ORAL | 0 refills | Status: DC | PRN

## 2020-05-06 MED ORDER — MIDAZOLAM HCL 2 MG/2ML IJ SOLN
INTRAMUSCULAR | Status: AC
  Filled 2020-05-06: qty 2

## 2020-05-06 MED ORDER — POVIDONE-IODINE 10 % EX SWAB
2.0000 "application " | Freq: Once | CUTANEOUS | Status: AC
  Administered 2020-05-06: 2 via TOPICAL

## 2020-05-06 MED ORDER — PROMETHAZINE HCL 25 MG/ML IJ SOLN: 6.2500 mg | INTRAMUSCULAR | Status: DC | PRN

## 2020-05-06 MED ORDER — PHENYLEPHRINE HCL (PRESSORS) 10 MG/ML IV SOLN
INTRAVENOUS | Status: AC
  Filled 2020-05-06: qty 1

## 2020-05-06 MED ORDER — THEOPHYLLINE ER 300 MG PO TB12: 300.0000 mg | ORAL_TABLET | Freq: Two times a day (BID) | ORAL | Status: DC

## 2020-05-06 MED ORDER — EPHEDRINE SULFATE 50 MG/ML IJ SOLN
INTRAMUSCULAR | Status: DC | PRN
  Administered 2020-05-06: 15 mg via INTRAVENOUS
  Administered 2020-05-06: 10 mg via INTRAVENOUS
  Administered 2020-05-06: 5 mg via INTRAVENOUS
  Administered 2020-05-06 (×2): 10 mg via INTRAVENOUS

## 2020-05-06 MED ORDER — FENTANYL CITRATE (PF) 100 MCG/2ML IJ SOLN
INTRAMUSCULAR | Status: AC
  Administered 2020-05-06: 50 ug
  Filled 2020-05-06: qty 2

## 2020-05-06 MED ORDER — PROPOFOL 10 MG/ML IV BOLUS
INTRAVENOUS | Status: DC | PRN
  Administered 2020-05-06: 100 mg via INTRAVENOUS
  Administered 2020-05-06: 50 mg via INTRAVENOUS

## 2020-05-06 MED ORDER — SODIUM CHLORIDE 0.9 % IR SOLN
Status: DC | PRN
  Administered 2020-05-06: 1000 mL

## 2020-05-06 MED ORDER — CEFAZOLIN SODIUM-DEXTROSE 2-4 GM/100ML-% IV SOLN
2.0000 g | INTRAVENOUS | Status: AC
  Administered 2020-05-06: 2 g via INTRAVENOUS

## 2020-05-06 MED ORDER — HYDROMORPHONE HCL 1 MG/ML IJ SOLN: 0.2500 mg | INTRAMUSCULAR | Status: DC | PRN

## 2020-05-06 MED ORDER — VASOPRESSIN 20 UNIT/ML IV SOLN
INTRAVENOUS | Status: DC | PRN
  Administered 2020-05-06: .2 [IU] via INTRAVENOUS
  Administered 2020-05-06: .3 [IU] via INTRAVENOUS
  Administered 2020-05-06 (×6): .2 [IU] via INTRAVENOUS

## 2020-05-06 MED ORDER — 0.9 % SODIUM CHLORIDE (POUR BTL) OPTIME
TOPICAL | Status: DC | PRN
  Administered 2020-05-06: 1000 mL

## 2020-05-06 MED ORDER — LIDOCAINE HCL (CARDIAC) PF 100 MG/5ML IV SOSY
PREFILLED_SYRINGE | INTRAVENOUS | Status: DC | PRN
  Administered 2020-05-06: 50 mg via INTRAVENOUS

## 2020-05-06 MED ORDER — MIDAZOLAM HCL 2 MG/2ML IJ SOLN
INTRAMUSCULAR | Status: AC
  Administered 2020-05-06: 2 mg
  Filled 2020-05-06: qty 2

## 2020-05-06 MED ORDER — PHENYLEPHRINE HCL (PRESSORS) 10 MG/ML IV SOLN
INTRAVENOUS | Status: DC | PRN
  Administered 2020-05-06: 80 ug via INTRAVENOUS
  Administered 2020-05-06 (×4): 100 ug via INTRAVENOUS
  Administered 2020-05-06: 80 ug via INTRAVENOUS
  Administered 2020-05-06 (×2): 100 ug via INTRAVENOUS
  Administered 2020-05-06: 80 ug via INTRAVENOUS
  Administered 2020-05-06 (×2): 100 ug via INTRAVENOUS

## 2020-05-06 MED ORDER — OXYCODONE HCL 5 MG PO TABS: 5.0000 mg | ORAL_TABLET | Freq: Once | ORAL | Status: DC | PRN

## 2020-05-06 MED ORDER — BUPIVACAINE LIPOSOME 1.3 % IJ SUSP
INTRAMUSCULAR | Status: DC | PRN
  Administered 2020-05-06: 10 mL via PERINEURAL

## 2020-05-06 MED ORDER — CHLORHEXIDINE GLUCONATE 0.12 % MT SOLN
15.0000 mL | Freq: Once | OROMUCOSAL | Status: AC
  Administered 2020-05-06: 15 mL via OROMUCOSAL

## 2020-05-06 MED ORDER — ACETAMINOPHEN 500 MG PO TABS
ORAL_TABLET | ORAL | Status: AC
  Filled 2020-05-06: qty 2

## 2020-05-06 MED ORDER — STERILE WATER FOR IRRIGATION IR SOLN
Status: DC | PRN
  Administered 2020-05-06: 2000 mL

## 2020-05-06 MED ORDER — DEXAMETHASONE SODIUM PHOSPHATE 10 MG/ML IJ SOLN
INTRAMUSCULAR | Status: DC | PRN
  Administered 2020-05-06: 4 mg via INTRAVENOUS

## 2020-05-06 MED ORDER — ONDANSETRON HCL 4 MG PO TABS: 4.0000 mg | ORAL_TABLET | Freq: Three times a day (TID) | ORAL | 0 refills | Status: DC | PRN

## 2020-05-06 MED ORDER — CEFAZOLIN SODIUM-DEXTROSE 2-4 GM/100ML-% IV SOLN
INTRAVENOUS | Status: AC
  Filled 2020-05-06: qty 100

## 2020-05-06 MED ORDER — EPHEDRINE 5 MG/ML INJ
INTRAVENOUS | Status: AC
  Filled 2020-05-06: qty 10

## 2020-05-06 MED ORDER — LIDOCAINE 2% (20 MG/ML) 5 ML SYRINGE
INTRAMUSCULAR | Status: AC
  Filled 2020-05-06: qty 5

## 2020-05-06 MED ORDER — DEXAMETHASONE SODIUM PHOSPHATE 10 MG/ML IJ SOLN
INTRAMUSCULAR | Status: AC
  Filled 2020-05-06: qty 1

## 2020-05-06 MED ORDER — ROCURONIUM BROMIDE 100 MG/10ML IV SOLN
INTRAVENOUS | Status: DC | PRN
  Administered 2020-05-06: 70 mg via INTRAVENOUS

## 2020-05-06 MED ORDER — PHENYLEPHRINE 40 MCG/ML (10ML) SYRINGE FOR IV PUSH (FOR BLOOD PRESSURE SUPPORT)
PREFILLED_SYRINGE | INTRAVENOUS | Status: AC
  Filled 2020-05-06: qty 10

## 2020-05-06 MED ORDER — ONDANSETRON HCL 4 MG/2ML IJ SOLN
INTRAMUSCULAR | Status: AC
  Filled 2020-05-06: qty 2

## 2020-05-06 MED ORDER — BUPIVACAINE HCL (PF) 0.5 % IJ SOLN
INTRAMUSCULAR | Status: DC | PRN
  Administered 2020-05-06: 20 mL via PERINEURAL

## 2020-05-06 MED ORDER — LACTATED RINGERS IV SOLN: INTRAVENOUS | Status: DC

## 2020-05-06 MED ORDER — ONDANSETRON HCL 4 MG/2ML IJ SOLN
INTRAMUSCULAR | Status: DC | PRN
  Administered 2020-05-06: 4 mg via INTRAVENOUS

## 2020-05-06 SURGICAL SUPPLY — 60 items
BAG ZIPLOCK 12X15 (MISCELLANEOUS) ×2 IMPLANT
BLADE SAW SGTL 18X1.27X75 (BLADE) ×2 IMPLANT
CEMENT BONE R 1X40 (Cement) ×2 IMPLANT
CLSR STERI-STRIP ANTIMIC 1/2X4 (GAUZE/BANDAGES/DRESSINGS) ×2 IMPLANT
COOLER ICEMAN CLASSIC (MISCELLANEOUS) ×2 IMPLANT
COVER BACK TABLE 60X90IN (DRAPES) ×2 IMPLANT
COVER MAYO STAND STRL (DRAPES) ×2 IMPLANT
COVER SURGICAL LIGHT HANDLE (MISCELLANEOUS) ×2 IMPLANT
COVER WAND RF STERILE (DRAPES) ×2 IMPLANT
DECANTER SPIKE VIAL GLASS SM (MISCELLANEOUS) IMPLANT
DRAPE POUCH INSTRU U-SHP 10X18 (DRAPES) ×2 IMPLANT
DRAPE SHEET LG 3/4 BI-LAMINATE (DRAPES) ×2 IMPLANT
DRAPE SURG 17X11 SM STRL (DRAPES) ×2 IMPLANT
DRAPE U-SHAPE 47X51 STRL (DRAPES) ×2 IMPLANT
DRSG MEPILEX BORDER 4X8 (GAUZE/BANDAGES/DRESSINGS) ×2 IMPLANT
DURAPREP 26ML APPLICATOR (WOUND CARE) ×2 IMPLANT
ELECT REM PT RETURN 15FT ADLT (MISCELLANEOUS) ×2 IMPLANT
GLENOID MOD PE 4 PEG SZ 2 (Shoulder) ×2 IMPLANT
GLENOID MOD POST TM SZ2 (Post) ×2 IMPLANT
GLOVE SRG 8 PF TXTR STRL LF DI (GLOVE) ×2 IMPLANT
GLOVE SURG ENC MOIS LTX SZ7 (GLOVE) ×2 IMPLANT
GLOVE SURG ENC MOIS LTX SZ7.5 (GLOVE) ×2 IMPLANT
GLOVE SURG ENC MOIS LTX SZ8 (GLOVE) ×2 IMPLANT
GLOVE SURG UNDER POLY LF SZ7.5 (GLOVE) ×2 IMPLANT
GLOVE SURG UNDER POLY LF SZ8 (GLOVE) ×2
GOWN STRL REUS W/TWL LRG LVL3 (GOWN DISPOSABLE) ×6 IMPLANT
HANDPIECE INTERPULSE COAX TIP (DISPOSABLE) ×1
HEAD HUMERAL BIPOLAR 38X19X39 (Miscellaneous) ×1 IMPLANT
HEAD HUMERAL COMP STD (Orthopedic Implant) ×1 IMPLANT
HOOD PEEL AWAY FLYTE STAYCOOL (MISCELLANEOUS) ×6 IMPLANT
HUMERAL HEAD BIPOLAR 38X19X39 (Miscellaneous) ×2 IMPLANT
HUMERAL HEAD COMP STD (Orthopedic Implant) ×2 IMPLANT
KIT BASIN OR (CUSTOM PROCEDURE TRAY) ×2 IMPLANT
KIT TURNOVER KIT A (KITS) ×2 IMPLANT
NS IRRIG 1000ML POUR BTL (IV SOLUTION) ×2 IMPLANT
PACK SHOULDER (CUSTOM PROCEDURE TRAY) ×2 IMPLANT
PAD COLD SHLDR WRAP-ON (PAD) ×2 IMPLANT
PENCIL SMOKE EVACUATOR (MISCELLANEOUS) IMPLANT
PIN THREADED REVERSE (PIN) ×2 IMPLANT
PROTECTOR NERVE ULNAR (MISCELLANEOUS) ×2 IMPLANT
RESTRAINT HEAD UNIVERSAL NS (MISCELLANEOUS) ×2 IMPLANT
SET HNDPC FAN SPRY TIP SCT (DISPOSABLE) ×1 IMPLANT
SLING ARM IMMOBILIZER LRG (SOFTGOODS) ×2 IMPLANT
SLING ARM IMMOBILIZER MED (SOFTGOODS) ×2 IMPLANT
SMARTMIX MINI TOWER (MISCELLANEOUS) ×2
STEM HUMERAL STRL 10MMX55MM (Stem) ×2 IMPLANT
SUCTION FRAZIER HANDLE 12FR (TUBING) ×1
SUCTION TUBE FRAZIER 12FR DISP (TUBING) ×1 IMPLANT
SUPPORT WRAP ARM LG (MISCELLANEOUS) ×2 IMPLANT
SUT FIBERWIRE #2 38 REV NDL BL (SUTURE) ×8
SUT MAXBRAID #5 CCS-NDL 2PK (SUTURE) ×4 IMPLANT
SUT VIC AB 1 CT1 36 (SUTURE) ×2 IMPLANT
SUT VIC AB 2-0 CT1 27 (SUTURE) ×1
SUT VIC AB 2-0 CT1 TAPERPNT 27 (SUTURE) ×1 IMPLANT
SUT VIC AB 3-0 SH 8-18 (SUTURE) ×2 IMPLANT
SUTURE FIBERWR#2 38 REV NDL BL (SUTURE) ×4 IMPLANT
TOWEL OR 17X26 10 PK STRL BLUE (TOWEL DISPOSABLE) ×2 IMPLANT
TOWEL OR NON WOVEN STRL DISP B (DISPOSABLE) ×2 IMPLANT
TOWER SMARTMIX MINI (MISCELLANEOUS) ×1 IMPLANT
WATER STERILE IRR 1000ML POUR (IV SOLUTION) ×4 IMPLANT

## 2020-05-06 NOTE — Discharge Instructions (Signed)
Diet: As you were doing prior to hospitalization   Shower:  May shower but keep the wounds dry, use an occlusive plastic wrap, NO SOAKING IN TUB.  If the bandage gets wet, change with a clean dry gauze.   Dressing:  You may change your dressing 3-5 days with clean gauze and tape after surgery,if you see drainage through your dressing. If you do not see drainage, you may keep your dressing in place until your follow up visit. There are sticky tapes (steri-strips) on your wounds and all the stitches are absorbable.  Leave the steri-strips in place when changing your dressings, they will peel off with time, usually 2-3 weeks.  Activity:  Increase activity slowly as tolerated, but follow the weight bearing instructions below.  The rules on driving is that you can not be taking narcotics while you drive, and you must feel in control of the vehicle.    Weight Bearing: No bearing weight with left arm. Keep left arm in sling. Ok to do elbow, wrist, and hand exercises as given to you by occupational therapy.   To prevent constipation: you may use a stool softener such as -  Colace (over the counter) 100 mg by mouth twice a day  Drink plenty of fluids (prune juice may be helpful) and high fiber foods Miralax (over the counter) for constipation as needed.    Itching:  If you experience itching with your medications, try taking only a single pain pill, or even half a pain pill at a time.  You may take up to 10 pain pills per day, and you can also use benadryl over the counter for itching or also to help with sleep.   Precautions:  If you experience chest pain or shortness of breath - call 911 immediately for transfer to the hospital emergency department!!  If you develop a fever greater that 101 F, purulent drainage from wound, increased redness or drainage from wound, or calf pain -- Call the office at [phone]                                                Follow- Up Appointment:  Please call for an  appointment to be seen in 2 weeks [place] - [phone]

## 2020-05-06 NOTE — Evaluation (Signed)
Occupational Therapy Evaluation Patient Details Name: Melissa Faulkner MRN: [id number] DOB: [birth year] Today's Date: 05/06/2020    History of Present Illness Patient is a 75 year old female s/p L shoulder arthroplasty   Clinical Impression   Patient is a 75 year old female s/p shoulder replacement without functional use of left non dominant upper extremity secondary to effects of surgery and interscalene block and shoulder precautions. Therapist provided education and instruction to patient and spouse in regards to exercises, precautions, positioning, donning upper extremity clothing and bathing while maintaining shoulder precautions, ice and edema management and donning/doffing sling. Patient and spouse verbalized understanding and demonstrated as needed. Patient needed assistance to donn shirt, underwear, pants, socks and shoes and provided with instruction on compensatory strategies to perform ADLs. Patient to follow up with MD for further therapy needs.      Follow Up Recommendations  Follow surgeon's recommendation for DC plan and follow-up therapies    Equipment Recommendations  None recommended by OT       Precautions / Restrictions Precautions Precautions: Shoulder Type of Shoulder Precautions: AROM elbow, wrist, hand ok. NO A/PROM Shoulder Interventions: Shoulder sling/immobilizer;Off for dressing/bathing/exercises Precaution Booklet Issued: Yes (comment) Required Braces or Orthoses: Sling Restrictions Weight Bearing Restrictions: Yes LUE Weight Bearing: Non weight bearing      Mobility  Transfers Overall transfer level: Independent Equipment used: None                  Balance Overall balance assessment: Mild deficits observed, not formally tested                                         ADL either performed or assessed with clinical judgement   ADL Overall ADL's : Needs assistance/impaired Eating/Feeding: Independent   Grooming: Wash/dry  hands   Upper Body Bathing: Minimal assistance;Standing   Lower Body Bathing: Minimal assistance;Sit to/from stand   Upper Body Dressing : Minimal assistance;Sitting;Cueing for sequencing;Cueing for compensatory techniques Upper Body Dressing Details (indicate cue type and reason): assist to thread L UE due to numbness, patient able to pull shirt overhead and thread R UE Lower Body Dressing: Minimal assistance;Sitting/lateral leans;Sit to/from stand Lower Body Dressing Details (indicate cue type and reason): to pull up clothing over buttock Toilet Transfer: Independent   Toileting- Clothing Manipulation and Hygiene: Minimal assistance;Sitting/lateral lean;Sit to/from stand       Functional mobility during ADLs: Independent                    Pertinent Vitals/Pain Pain Assessment: Faces Faces Pain Scale: Hurts little more Pain Location: L shoulder Pain Descriptors / Indicators: Guarding;Grimacing;Heaviness;Numbness Pain Intervention(s): Monitored during session     Hand Dominance Right   Extremity/Trunk Assessment Upper Extremity Assessment Upper Extremity Assessment: LUE deficits/detail LUE Deficits / Details: + nerve block LUE: Unable to fully assess due to pain;Unable to fully assess due to immobilization   Lower Extremity Assessment Lower Extremity Assessment: Overall WFL for tasks assessed   Cervical / Trunk Assessment Cervical / Trunk Assessment: Normal   Communication Communication Communication: No difficulties   Cognition Arousal/Alertness: Awake/alert Behavior During Therapy: WFL for tasks assessed/performed Overall Cognitive Status: Within Functional Limits for tasks assessed  Exercises Exercises: Shoulder   Shoulder Instructions Shoulder Instructions Donning/doffing shirt without moving shoulder: Minimal assistance;Caregiver independent with task;Patient able to independently direct  caregiver Method for sponge bathing under operated UE: Minimal assistance;Caregiver independent with task;Patient able to independently direct caregiver Donning/doffing sling/immobilizer: Maximal assistance;Caregiver independent with task;Patient able to independently direct caregiver Correct positioning of sling/immobilizer: Caregiver independent with task;Patient able to independently direct caregiver Pendulum exercises (written home exercise program):  (N/A) ROM for elbow, wrist and digits of operated UE: Patient able to independently direct caregiver;Caregiver independent with task Sling wearing schedule (on at all times/off for ADL's): Patient able to independently direct caregiver;Caregiver independent with task Proper positioning of operated UE when showering: Patient able to independently direct caregiver;Caregiver independent with task Dressing change:  (N/A) Positioning of UE while sleeping: Patient able to independently direct caregiver;Caregiver independent with task    Home Living Family/patient expects to be discharged to:: Private residence Living Arrangements: Spouse/significant other Available Help at Discharge: Family;Available 24 hours/day Type of Home: House Home Access: Level entry     Home Layout: One level     Bathroom Shower/Tub: Teacher, early years/pre: Handicapped height     Home Equipment: Shower seat          Prior Functioning/Environment Level of Independence: Independent                 OT Problem List: Pain;Impaired UE functional use;Decreased knowledge of precautions         OT Goals(Current goals can be found in the care plan section) Acute Rehab OT Goals Patient Stated Goal: home OT Goal Formulation: All assessment and education complete, DC therapy   AM-PAC OT "6 Clicks" Daily Activity     Outcome Measure Help from another person eating meals?: None Help from another person taking care of personal grooming?: None Help  from another person toileting, which includes using toliet, bedpan, or urinal?: A Little Help from another person bathing (including washing, rinsing, drying)?: A Little Help from another person to put on and taking off regular upper body clothing?: A Little Help from another person to put on and taking off regular lower body clothing?: A Little 6 Click Score: 20   End of Session Equipment Utilized During Treatment: Other (comment) (sling) Nurse Communication: Other (comment) (OT complete)  Activity Tolerance: Patient tolerated treatment well Patient left: in chair;with call bell/phone within reach;with family/visitor present  OT Visit Diagnosis: Pain Pain - Right/Left: Left Pain - part of body: Shoulder                Time: 1450-1517 OT Time Calculation (min): 27 min Charges:  OT General Charges $OT Visit: 1 Visit OT Evaluation $OT Eval Low Complexity: 1 Low OT Treatments $Self Care/Home Management : 8-22 mins  Delbert Phenix OT OT pager: [place] 05/06/2020, 3:26 PM

## 2020-05-06 NOTE — Anesthesia Procedure Notes (Signed)
Procedure Name: Intubation Date/Time: 05/06/2020 11:23 AM Performed by: Adalberto Ill, CRNA Pre-anesthesia Checklist: Patient identified, Emergency Drugs available, Suction available, Patient being monitored and Timeout performed Patient Re-evaluated:Patient Re-evaluated prior to induction Oxygen Delivery Method: Circle system utilized Preoxygenation: Pre-oxygenation with 100% oxygen Induction Type: IV induction Ventilation: Mask ventilation without difficulty Laryngoscope Size: Miller and 2 Grade View: Grade I Tube type: Oral Tube size: 7.0 mm Number of attempts: 1 Airway Equipment and Method: Stylet Placement Confirmation: ETT inserted through vocal cords under direct vision,  positive ETCO2 and breath sounds checked- equal and bilateral Secured at: 21 cm Tube secured with: Tape Dental Injury: Teeth and Oropharynx as per pre-operative assessment

## 2020-05-06 NOTE — Interval H&P Note (Signed)
History and Physical Interval Note:  05/06/2020 10:38 AM  Melissa Faulkner  has presented today for surgery, with the diagnosis of DJD LEFT SHOULDER.  The various methods of treatment have been discussed with the patient and family. After consideration of risks, benefits and other options for treatment, the patient has consented to  Procedure(s): TOTAL SHOULDER ARTHROPLASTY (Left) as a surgical intervention.  The patient's history has been reviewed, patient examined, no change in status, stable for surgery.  I have reviewed the patient's chart and labs.  Questions were answered to the patient's satisfaction.     [name]

## 2020-05-06 NOTE — Progress Notes (Signed)
Assisted Dr. Miller with left, ultrasound guided, interscalene  block. Side rails up, monitors on throughout procedure. See vital signs in flow sheet. Tolerated Procedure well.  

## 2020-05-06 NOTE — Anesthesia Procedure Notes (Signed)
Anesthesia Regional Block: Interscalene brachial plexus block   Pre-Anesthetic Checklist: ,, timeout performed, Correct Patient, Correct Site, Correct Laterality, Correct Procedure, Correct Position, site marked, Risks and benefits discussed,  Surgical consent,  Pre-op evaluation,  At surgeon's request and post-op pain management  Laterality: Left  Prep: chloraprep       Needles:  Injection technique: Single-shot  Needle Type: Stimiplex     Needle Length: 9cm  Needle Gauge: 21     Additional Needles:   Procedures:,,,, ultrasound used (permanent image in chart),,,,  Narrative:  Start time: 05/06/2020 10:57 AM End time: 05/06/2020 11:02 AM Injection made incrementally with aspirations every 5 mL.  Performed by: Personally  Anesthesiologist: Lynda Rainwater, MD

## 2020-05-06 NOTE — H&P (Signed)
SHOULDER ARTHROPLASTY ADMISSION H&P  Patient ID: Melissa Faulkner MRN: [id number] DOB/AGE: [birth year] 75 y.o.  Chief Complaint: left shoulder pain.  Planned Procedure Date: 05/06/20 Medical Clearance by Dr. Woody Seller  Cardiac Clearance by Coletta Memos, NP  HPI: Melissa Faulkner is a 75 y.o. female who presents for evaluation of DJD LEFT SHOULDER. The patient has a history of pain and functional disability in the left shoulder due to arthritis and has failed non-surgical conservative treatments for greater than 12 weeks to include NSAID's and/or analgesics and activity modification.  Onset of symptoms was gradual, starting 1 years ago with gradually worsening course since that time. The patient noted no past surgery on the left shoulder.  Patient currently rates pain at 5 out of 10 with activity. Patient has night pain, worsening of pain with activity and weight bearing, pain that interferes with activities of daily living and crepitus.  Patient has evidence of joint space narrowing by imaging studies.  There is no active infection.  Past Medical History:  Diagnosis Date  . Arthritis   . Asthma   . Atrial fibrillation ([place])   . Heart murmur    noted in 71 -no echo ever done  . Hyperlipidemia   . Hypertension   . Hypothyroidism   . Skin cancer    R arm  . Wears glasses    Past Surgical History:  Procedure Laterality Date  . ABDOMINAL HYSTERECTOMY  2002  . CARPAL TUNNEL RELEASE  2005   both rt/lt  . COLONOSCOPY    . GREAT TOE ARTHRODESIS, INTERPHALANGEAL JOINT  2014   right  . GREAT TOE ARTHRODESIS, [place] PROCEDURE  2015   left  . MENISCUS REPAIR    . PLANTAR FASCIA SURGERY  2015   left  . SKIN CANCER EXCISION     R arm  . TOTAL SHOULDER ARTHROPLASTY  2012   right-[facility]  . TUBAL LIGATION     Allergies  Allergen Reactions  . Zestril [Lisinopril]     Lips swelled  . Zocor [Simvastatin]     Leg cramps   Prior to Admission medications   Medication Sig Start Date End Date  Taking? Authorizing Provider  amLODipine (NORVASC) 5 MG tablet Take 5 mg by mouth daily.   Yes [provider]  aspirin EC 81 MG tablet Take 81 mg by mouth daily. Swallow whole.   Yes [provider]  atorvastatin (LIPITOR) 40 MG tablet Take 40 mg by mouth daily.   Yes [provider]  Calcium Carbonate-Vit D-Min (CALCIUM 1200 PO) Take 1,200 mg by mouth daily.   Yes [provider]  chlorthalidone (HYGROTON) 25 MG tablet Take 12.5 mg by mouth daily.   Yes [provider]  CRANBERRY PO Take 500 mg by mouth daily.   Yes [provider]  Cyanocobalamin (B-12) 2500 MCG TABS Take 2,500 mcg by mouth daily.   Yes [provider]  levothyroxine (SYNTHROID) 50 MCG tablet Take 50 mcg by mouth daily before breakfast.   Yes [provider]  metoprolol succinate (TOPROL-XL) 25 MG 24 hr tablet Take 1 tablet (25 mg total) by mouth daily. 12/25/18  Yes Herminio Commons, MD  Multiple Vitamins-Minerals (MULTIVITAMIN WITH MINERALS) tablet Take 1 tablet by mouth daily.   Yes [provider]  Omega 3-6-9 Fatty Acids (OMEGA 3-6-9 COMPLEX PO) Take 1 capsule by mouth daily.   Yes [provider]  rivaroxaban (XARELTO) 20 MG TABS tablet TAKE (1) TABLET DAILY WITH SUPPER. Patient taking  differently: Take 20 mg by mouth daily with supper. TAKE (1) TABLET DAILY WITH SUPPER. 01/24/20  Yes Branch, Alphonse Guild, MD  theophylline (UNIPHYL) 600 MG 24 hr tablet Take 300 mg by mouth 2 (two) times a day.  01/12/17  Yes [provider]  VENTOLIN HFA 108 (90 Base) MCG/ACT inhaler Inhale 2 puffs into the lungs every 4 (four) hours as needed for wheezing or shortness of breath. 11/29/16   [provider]   Social History   Socioeconomic History  . Marital status: Married    Spouse name: Not on file  . Number of children: Not on file  . Years of education: Not on file  . Highest education level: Not on file  Occupational  History  . Not on file  Tobacco Use  . Smoking status: Never Smoker  . Smokeless tobacco: Never Used  Vaping Use  . Vaping Use: Never used  Substance and Sexual Activity  . Alcohol use: Not Currently  . Drug use: No  . Sexual activity: Not on file  Other Topics Concern  . Not on file  Social History Narrative  . Not on file   Social Determinants of Health   Financial Resource Strain: Not on file  Food Insecurity: Not on file  Transportation Needs: Not on file  Physical Activity: Not on file  Stress: Not on file  Social Connections: Not on file   Family History  Problem Relation Age of Onset  . Clotting disorder Mother        blood clot post surgery  . Stomach cancer Father     ROS: Currently denies lightheadedness, dizziness, Fever, chills, CP, SOB.   No personal history of DVT, PE, MI, or CVA. No loose teeth or dentures All other systems have been reviewed and were otherwise currently negative with the exception of those mentioned in the HPI and as above.  Objective: Vitals: Ht: 5' 4.5" Wt: 159.8 lbs Temp: 97.9 BP: 162/75 Pulse: 78 O2 97% on room air.   Physical Exam: General: Alert, NAD.   HEENT: EOMI, Good Neck Extension  Pulm: No increased work of breathing.  Clear B/L A/P w/o crackle or wheeze.  CV: RRR, No m/g/r appreciated  GI: soft, NT, ND Neuro: Neuro without gross focal deficit.  Sensation intact distally Skin: No lesions in the area of chief complaint MSK/Surgical Site: left shoulder pain with range of motion.  Forward flexion/abduction approximately 140 deg.  Internal rotation to L5.  External rotation to 20 deg.  No AC pain.  No Biceps pain.  NVI distally.  Imaging Review Plain radiographs demonstrate severe degenerative joint disease of the left shoulder.    Assessment: DJD LEFT SHOULDER Principal Problem:   Osteoarthritis of left shoulder   Plan: Plan for Procedure(s): TOTAL SHOULDER ARTHROPLASTY  The patient history, physical exam,  clinical judgement of the provider and imaging are consistent with end stage degenerative joint disease and total joint arthroplasty is deemed medically necessary. The treatment options including medical management, injection therapy, and arthroplasty were discussed at length. The risks and benefits of Procedure(s): TOTAL SHOULDER ARTHROPLASTY were presented and reviewed.  The risks of nonoperative treatment, versus surgical intervention including but not limited to continued pain, aseptic loosening, stiffness, dislocation/subluxation, infection, bleeding, nerve injury, blood clots, cardiopulmonary complications, morbidity, mortality, among others were discussed. The patient verbalizes understanding and wishes to proceed with the plan.  Patient is being admitted for surgery, OT, pain control, prophylactic antibiotics, VTE prophylaxis, progressive ambulation, ADL's and discharge planning.  Dental prophylaxis discussed and recommended for 2 years postoperatively.   The patient does meet the criteria for TXA which will be used perioperatively.    The patient is planning to be discharged home care of husband.   Ventura Bruns, PA-C 05/06/2020 10:27 AM

## 2020-05-06 NOTE — Op Note (Signed)
05/06/2020  1:55 PM  PATIENT:  Melissa Faulkner    PRE-OPERATIVE DIAGNOSIS:  Left shoulder osteoarthritis  POST-OPERATIVE DIAGNOSIS:  Same  PROCEDURE:  Total Shoulder Arthroplasty  SURGEON:  [name], MD  PHYSICIAN ASSISTANT: Merlene Pulling, PA-C, present and scrubbed throughout the case, critical for completion in a timely fashion, and for retraction, instrumentation, and closure.  ANESTHESIA:   General with an interscalene block  ESTIMATED BLOOD LOSS: 189ml  UNIQUE ASPECTS OF THE CASE: There was substantial inferior glenoid wear, but I was able to get containment within all of the holes except the anterior inferior hole was bicortical.  The rotator cuff was present, may be just a little bit attritional superiorly, but still connected.  There was a large bubble of soft tissue at the rotator interval, I was initially concerned she was rotator cuff deficient, but ended up that this is primarily the rotator interval.  I prepped with a size 9, and ended up using a 10 on the humerus, because the 9 was loose after the final trial when I removed the trial after coming back from the glenoid.  PREOPERATIVE INDICATIONS:  Melissa Faulkner is a  75 y.o. female who failed conservative measures and elected for surgical management.    The risks benefits and alternatives were discussed with the patient preoperatively including but not limited to the risks of infection, bleeding, nerve injury, cardiopulmonary complications, the need for revision surgery, dislocation, loosening, incomplete relief of pain, among others, and the patient was willing to proceed.   OPERATIVE IMPLANTS: Biomet size 10 micro press-fit humeral stem, size 38+19 versa-dial humeral head, set in the D position with increased coverage posteriorly, with a 2 cemented glenoid polyethylene 3 peg implant with a central regenerex noncemented post.   OPERATIVE FINDINGS: Advanced glenohumeral osteoarthritis involving the glenoid and the  humeral head with substantial osteophyte formation inferiorly.   OPERATIVE PROCEDURE: The patient was brought to the operating room and placed in the supine position. General anesthesia was administered. IV antibiotics were given.  The upper extremity was prepped and draped in usual sterile fashion. The patient was in a beachchair position with all bony prominences padded.   Time out was performed and a deltopectoral approach was carried out. The biceps tendon was tenodesed to the pectoralis tendon. The subscapularis was released, tagging it with a #2 FiberWire, leaving a cuff of tendon for repair.   The inferior osteophyte was removed, and release of the capsule off of the humeral side was completed. The head was dislocated, and I reamed sequentially. I placed the humeral cutting guide at 30 of retroversion, and then pinned this into place, and made my humeral neck cut. This was at the appropriate level.   I then placed deep retractors and exposed the glenoid. I excised the labrum circumferentially, taking care to protect the axillary nerve inferiorly.   I then placed a guidewire into the center position, controlling appropriate version and inclination. I then reamed over the guidewire with the small reamer, and was satisfied with the preparation. I preserved the subchondral bone in order to maximize the strength and minimize the risk for subsequent subsidence.   I then drilled the central hole for the regenerex peg, and then placed the guide, and then drilled the 3 peripheral peg holes. I had excellent bony circumferential contact.   I then cleaned the glenoid, irrigated it copiously, and then dried it and cemented the prosthesis into place. Excellent seating was achieved. I had full exposure. The  cement cured while I turned my attention to the humeral side.   I sequentially broached, up to the selected size, with the broach set at 30 of retroversion. I placed 3 #2 Fiberwire through the bone for  subsequent repair.  I then placed the real stem. I trialed with multiple heads, and the above-named component was selected. Increased posterior coverage improved the coverage. The soft tissue tension was appropriate.   I then impacted the real humeral head into place, reduced the head, and irrigated copiously. Excellent stability and range of motion was achieved. I repaired the subscapularis with a total of 5 #2 FiberWire; one for the interval, one for the corner, and then the remaining three from the lesser tuberosity which had already been passed.  Excellent repair achieved and I irrigated copiously once more. The subcutaneous tissue was closed with Vicryl including the deltopectoral fascia.   The skin was closed with Steri-Strips and sterile gauze was applied. She had a preoperative nerve block. She tolerated the procedure well and there were no complications.

## 2020-05-06 NOTE — Transfer of Care (Signed)
Immediate Anesthesia Transfer of Care Note  Patient: Melissa Faulkner  Procedure(s) Performed: TOTAL SHOULDER ARTHROPLASTY (Left Shoulder)  Patient Location: PACU  Anesthesia Type:GA combined with regional for post-op pain  Level of Consciousness: awake, alert , oriented and patient cooperative  Airway & Oxygen Therapy: Patient Spontanous Breathing and Patient connected to face mask oxygen  Post-op Assessment: Report given to RN and Post -op Vital signs reviewed and stable  Post vital signs: Reviewed and stable  Last Vitals:  Vitals Value Taken Time  BP 110/66 05/06/20 1403  Temp    Pulse 98 05/06/20 1405  Resp 20 05/06/20 1405  SpO2 100 % 05/06/20 1405  Vitals shown include unvalidated device data.  Last Pain:  Vitals:   05/06/20 1053  TempSrc:   PainSc: 0-No pain      Patients Stated Pain Goal: 4 (95/28/41 3244)  Complications: No complications documented.

## 2020-05-06 NOTE — Anesthesia Postprocedure Evaluation (Signed)
Anesthesia Post Note  Patient: Melissa Faulkner  Procedure(s) Performed: TOTAL SHOULDER ARTHROPLASTY (Left Shoulder)     Patient location during evaluation: PACU Anesthesia Type: General Level of consciousness: awake and alert Pain management: pain level controlled Vital Signs Assessment: post-procedure vital signs reviewed and stable Respiratory status: spontaneous breathing, nonlabored ventilation and respiratory function stable Cardiovascular status: blood pressure returned to baseline and stable Postop Assessment: no apparent nausea or vomiting Anesthetic complications: no   No complications documented.  Last Vitals:  Vitals:   05/06/20 1430 05/06/20 1504  BP: (!) 96/50 (!) 92/55  Pulse: 76 65  Resp: 13 17  Temp: 36.5 C (!) 36.4 C  SpO2: 97% 95%    Last Pain:  Vitals:   05/06/20 1504  TempSrc:   PainSc: 0-No pain                 Lynda Rainwater

## 2020-05-09 ENCOUNTER — Encounter (HOSPITAL_COMMUNITY): Payer: Self-pay | Admitting: Orthopedic Surgery

## 2020-05-13 DIAGNOSIS — E039 Hypothyroidism, unspecified: Secondary | ICD-10-CM | POA: Diagnosis not present

## 2020-05-13 DIAGNOSIS — I4892 Unspecified atrial flutter: Secondary | ICD-10-CM | POA: Diagnosis not present

## 2020-05-13 DIAGNOSIS — I10 Essential (primary) hypertension: Secondary | ICD-10-CM | POA: Diagnosis not present

## 2020-05-13 DIAGNOSIS — Z299 Encounter for prophylactic measures, unspecified: Secondary | ICD-10-CM | POA: Diagnosis not present

## 2020-05-13 DIAGNOSIS — I4891 Unspecified atrial fibrillation: Secondary | ICD-10-CM | POA: Diagnosis not present

## 2020-05-13 DIAGNOSIS — Z23 Encounter for immunization: Secondary | ICD-10-CM | POA: Diagnosis not present

## 2020-05-19 DIAGNOSIS — M19012 Primary osteoarthritis, left shoulder: Secondary | ICD-10-CM | POA: Diagnosis not present

## 2020-06-16 DIAGNOSIS — M19012 Primary osteoarthritis, left shoulder: Secondary | ICD-10-CM | POA: Diagnosis not present

## 2020-06-23 DIAGNOSIS — M6281 Muscle weakness (generalized): Secondary | ICD-10-CM | POA: Diagnosis not present

## 2020-06-23 DIAGNOSIS — M19012 Primary osteoarthritis, left shoulder: Secondary | ICD-10-CM | POA: Diagnosis not present

## 2020-06-23 DIAGNOSIS — M25612 Stiffness of left shoulder, not elsewhere classified: Secondary | ICD-10-CM | POA: Diagnosis not present

## 2020-06-30 DIAGNOSIS — M19012 Primary osteoarthritis, left shoulder: Secondary | ICD-10-CM | POA: Diagnosis not present

## 2020-06-30 DIAGNOSIS — M6281 Muscle weakness (generalized): Secondary | ICD-10-CM | POA: Diagnosis not present

## 2020-06-30 DIAGNOSIS — M25612 Stiffness of left shoulder, not elsewhere classified: Secondary | ICD-10-CM | POA: Diagnosis not present

## 2020-07-08 DIAGNOSIS — M19012 Primary osteoarthritis, left shoulder: Secondary | ICD-10-CM | POA: Diagnosis not present

## 2020-07-08 DIAGNOSIS — M25612 Stiffness of left shoulder, not elsewhere classified: Secondary | ICD-10-CM | POA: Diagnosis not present

## 2020-07-08 DIAGNOSIS — M6281 Muscle weakness (generalized): Secondary | ICD-10-CM | POA: Diagnosis not present

## 2020-07-14 DIAGNOSIS — M25612 Stiffness of left shoulder, not elsewhere classified: Secondary | ICD-10-CM | POA: Diagnosis not present

## 2020-07-14 DIAGNOSIS — M25561 Pain in right knee: Secondary | ICD-10-CM | POA: Diagnosis not present

## 2020-07-16 DIAGNOSIS — M25561 Pain in right knee: Secondary | ICD-10-CM | POA: Diagnosis not present

## 2020-07-17 DIAGNOSIS — M19012 Primary osteoarthritis, left shoulder: Secondary | ICD-10-CM | POA: Diagnosis not present

## 2020-07-17 DIAGNOSIS — M25612 Stiffness of left shoulder, not elsewhere classified: Secondary | ICD-10-CM | POA: Diagnosis not present

## 2020-07-17 DIAGNOSIS — M6281 Muscle weakness (generalized): Secondary | ICD-10-CM | POA: Diagnosis not present

## 2020-07-22 DIAGNOSIS — M19012 Primary osteoarthritis, left shoulder: Secondary | ICD-10-CM | POA: Diagnosis not present

## 2020-07-22 DIAGNOSIS — M25612 Stiffness of left shoulder, not elsewhere classified: Secondary | ICD-10-CM | POA: Diagnosis not present

## 2020-07-22 DIAGNOSIS — M6281 Muscle weakness (generalized): Secondary | ICD-10-CM | POA: Diagnosis not present

## 2020-07-23 ENCOUNTER — Other Ambulatory Visit: Payer: Self-pay | Admitting: Family Medicine

## 2020-07-23 DIAGNOSIS — M1711 Unilateral primary osteoarthritis, right knee: Secondary | ICD-10-CM | POA: Diagnosis not present

## 2020-07-24 ENCOUNTER — Telehealth: Payer: Self-pay | Admitting: Cardiology

## 2020-07-24 NOTE — Telephone Encounter (Signed)
   [facility] Pre-operative Risk Assessment    Patient Name: Melissa Faulkner  DOB: 1945-11-29 MRN: [id number]  HEARTCARE STAFF:  - IMPORTANT!!!!!! Under Visit Info/Reason for Call, type in Other and utilize the format Clearance MM/DD/YY or Clearance TBD. Do not use dashes or single digits. - Please review there is not already an duplicate clearance open for this procedure. - If request is for dental extraction, please clarify the # of teeth to be extracted. - If the patient is currently at the dentist's office, call Pre-Op Callback Staff (MA/nurse) to input urgent request.  - If the patient is not currently in the dentist office, please route to the Pre-Op pool.  Request for surgical clearance:  What type of surgery is being performed right total knee replacement   When is this surgery scheduled? TBD  What type of clearance is required (medical clearance vs. Pharmacy clearance to hold med vs. Both)? BOTH  Are there any medications that need to be held prior to surgery and how long? PLEASE ADVISE   Practice name and name of physician performing surgery? [facility]   What is the office phone number? [phone]   7.   What is the office fax number? [phone]   8.   Anesthesia type (None, local, MAC, general) ? SPINAL ADDUCTOR BLOCK   Jannet Askew 07/24/2020, 4:34 PM  _________________________________________________________________   (provider comments below)

## 2020-07-28 NOTE — Telephone Encounter (Signed)
   Primary Cardiologist: Carlyle Dolly, MD  Chart reviewed as part of pre-operative protocol coverage. Given past medical history and time since last visit, based on ACC/AHA guidelines, Melissa Faulkner would be at acceptable risk for the planned procedure without further cardiovascular testing.   Her RCRI is a class I risk, 0.4% risk of major cardiac event.  She may hold her Xarelto for 3 days prior to procedure.  Please resume as soon as hemostasis is achieved at the discretion of the surgeon.  She is able to complete greater than 4 METS of physical activity.  Patient was advised that if she develops new symptoms prior to surgery to contact our office to arrange a follow-up appointment.  She verbalized understanding.  I will route this recommendation to the requesting party via Epic fax function and remove from pre-op pool.  Please call with questions.  [name]. Haili Donofrio NP-C    07/28/2020, 1:08 PM [facility] [address] Office [phone] Fax [phone]

## 2020-08-04 DIAGNOSIS — E785 Hyperlipidemia, unspecified: Secondary | ICD-10-CM | POA: Diagnosis not present

## 2020-08-04 DIAGNOSIS — Z299 Encounter for prophylactic measures, unspecified: Secondary | ICD-10-CM | POA: Diagnosis not present

## 2020-08-04 DIAGNOSIS — I4891 Unspecified atrial fibrillation: Secondary | ICD-10-CM | POA: Diagnosis not present

## 2020-08-04 DIAGNOSIS — I4892 Unspecified atrial flutter: Secondary | ICD-10-CM | POA: Diagnosis not present

## 2020-08-04 DIAGNOSIS — I10 Essential (primary) hypertension: Secondary | ICD-10-CM | POA: Diagnosis not present

## 2020-08-04 DIAGNOSIS — Z01818 Encounter for other preprocedural examination: Secondary | ICD-10-CM | POA: Diagnosis not present

## 2020-08-12 NOTE — Progress Notes (Signed)
Please enter orders for PAT scheduled for 08-22-20

## 2020-08-15 DIAGNOSIS — M1711 Unilateral primary osteoarthritis, right knee: Secondary | ICD-10-CM | POA: Diagnosis not present

## 2020-08-21 NOTE — Patient Instructions (Addendum)
DUE TO COVID-19 ONLY ONE VISITOR IS ALLOWED TO COME WITH YOU AND STAY IN THE WAITING ROOM ONLY DURING PRE OP AND PROCEDURE DAY OF SURGERY. THE 2 VISITORS  MAY VISIT WITH YOU AFTER SURGERY IN YOUR PRIVATE ROOM DURING VISITING HOURS ONLY!  YOU NEED TO HAVE A COVID 19 TEST ON__8/26_____THIS TEST MUST BE DONE BEFORE SURGERY,                 Melissa Faulkner     Your procedure is scheduled on: 09/02/20   Report to [place]  Entrance   Report to short stay at 5:15 AM     Call this number if you have problems the morning of surgery [place], NO [place].   No food after midnight.    You may have clear liquid until 4:30 AM.    At 4:00 AM drink pre surgery drink.   Nothing by mouth after 4:30 AM.    Take these medicines the morning of surgery with A SIP OF WATER: Theophyline, Metoprolol, Levothyroxine.   Use your inhaler and bring it with you.  Stop taking _Xarelto__________on __8/26________as instructed by __Dr. Branch___________.  Stop taking _ASA 81___ on 8/26_.               You may not have any metal on your body including hair pins and           piercings  Do not wear jewelry, make-up, lotions, powders or perfumes, deodorant          No polish on fingers or toes.   Do not bring valuables to the hospital. [place].  Contacts, dentures or bridgework may not be worn into surgery.                    Please read over the following fact sheets you were given: _____________________________________________________________________             [facility] - Preparing for Surgery Before surgery, you can play an important role.  Because skin is not sterile, your skin needs to be as free of germs as possible.  You can reduce the number of germs on your skin by washing with CHG (chlorahexidine gluconate) soap before surgery.  CHG is  an antiseptic cleaner which kills germs and bonds with the skin to continue killing germs even after washing. Please DO NOT use if you have an allergy to CHG or antibacterial soaps.  If your skin becomes reddened/irritated stop using the CHG and inform your nurse when you arrive at Short Stay. Do not shave (including legs and underarms) for at least 48 hours prior to the first CHG shower.    Please follow these instructions carefully:  1.  Shower with CHG Soap the night before surgery and the  morning of Surgery.  2.  If you choose to wash your hair, wash your hair first as usual with your  normal  shampoo.  3.  After you shampoo, rinse your hair and body thoroughly to remove the  shampoo.                                4.  Use CHG as you would any other liquid  soap.  You can apply chg directly  to the skin and wash                       Gently with a scrungie or clean washcloth.  5.  Apply the CHG Soap to your body ONLY FROM THE NECK DOWN.   Do not use on face/ open                           Wound or open sores. Avoid contact with eyes, ears mouth and genitals (private parts).                       Wash face,  Genitals (private parts) with your normal soap.             6.  Wash thoroughly, paying special attention to the area where your surgery  will be performed.  7.  Thoroughly rinse your body with warm water from the neck down.  8.  DO NOT shower/wash with your normal soap after using and rinsing off  the CHG Soap.             9.  Pat yourself dry with a clean towel.            10.  Wear clean pajamas.            11.  Place clean sheets on your bed the night of your first shower and do not  sleep with pets. Day of Surgery : Do not apply any lotions/deodorants the morning of surgery.  Please wear clean clothes to the hospital/surgery center.  FAILURE TO FOLLOW THESE INSTRUCTIONS MAY RESULT IN THE CANCELLATION OF YOUR SURGERY PATIENT SIGNATURE_________________________________  NURSE  SIGNATURE__________________________________  ________________________________________________________________________   Adam Phenix  An incentive spirometer is a tool that can help keep your lungs clear and active. This tool measures how well you are filling your lungs with each breath. Taking long deep breaths may help reverse or decrease the chance of developing breathing (pulmonary) problems (especially infection) following: A long period of time when you are unable to move or be active. BEFORE THE PROCEDURE  If the spirometer includes an indicator to show your best effort, your nurse or respiratory therapist will set it to a desired goal. If possible, sit up straight or lean slightly forward. Try not to slouch. Hold the incentive spirometer in an upright position. INSTRUCTIONS FOR USE  Sit on the edge of your bed if possible, or sit up as far as you can in bed or on a chair. Hold the incentive spirometer in an upright position. Breathe out normally. Place the mouthpiece in your mouth and seal your lips tightly around it. Breathe in slowly and as deeply as possible, raising the piston or the ball toward the top of the column. Hold your breath for 3-5 seconds or for as long as possible. Allow the piston or ball to fall to the bottom of the column. Remove the mouthpiece from your mouth and breathe out normally. Rest for a few seconds and repeat Steps 1 through 7 at least 10 times every 1-2 hours when you are awake. Take your time and take a few normal breaths between deep breaths. The spirometer may include an indicator to show your best effort. Use the indicator as a goal to work toward during each repetition. After each set of 10 deep breaths, practice coughing to be  sure your lungs are clear. If you have an incision (the cut made at the time of surgery), support your incision when coughing by placing a pillow or rolled up towels firmly against it. Once you are able to get out of  bed, walk around indoors and cough well. You may stop using the incentive spirometer when instructed by your caregiver.  RISKS AND COMPLICATIONS Take your time so you do not get dizzy or light-headed. If you are in pain, you may need to take or ask for pain medication before doing incentive spirometry. It is harder to take a deep breath if you are having pain. AFTER USE Rest and breathe slowly and easily. It can be helpful to keep track of a log of your progress. Your caregiver can provide you with a simple table to help with this. If you are using the spirometer at home, follow these instructions: [place] IF:  You are having difficultly using the spirometer. You have trouble using the spirometer as often as instructed. Your pain medication is not giving enough relief while using the spirometer. You develop fever of 100.5 F (38.1 C) or higher. SEEK IMMEDIATE MEDICAL CARE IF:  You cough up bloody sputum that had not been present before. You develop fever of 102 F (38.9 C) or greater. You develop worsening pain at or near the incision site. MAKE SURE YOU:  Understand these instructions. Will watch your condition. Will get help right away if you are not doing well or get worse. Document Released: 05/03/2006 Document Revised: 03/15/2011 Document Reviewed: 07/04/2006 [facility] Patient Information 2014 [place], [place].   ________________________________________________________________________

## 2020-08-22 ENCOUNTER — Encounter (HOSPITAL_COMMUNITY)
Admission: RE | Admit: 2020-08-22 | Discharge: 2020-08-22 | Disposition: A | Discharge status: Home / Self Care | Payer: Medicare Other | Source: Ambulatory Visit | Attending: Orthopedic Surgery | Admitting: Orthopedic Surgery

## 2020-08-22 ENCOUNTER — Other Ambulatory Visit: Payer: Self-pay

## 2020-08-22 ENCOUNTER — Encounter (HOSPITAL_COMMUNITY): Payer: Self-pay

## 2020-08-22 DIAGNOSIS — Z7901 Long term (current) use of anticoagulants: Secondary | ICD-10-CM | POA: Insufficient documentation

## 2020-08-22 DIAGNOSIS — M1711 Unilateral primary osteoarthritis, right knee: Secondary | ICD-10-CM | POA: Diagnosis not present

## 2020-08-22 DIAGNOSIS — Z01812 Encounter for preprocedural laboratory examination: Secondary | ICD-10-CM | POA: Diagnosis not present

## 2020-08-22 LAB — CBC
HCT: 42.2 % (ref 36.0–46.0)
Hemoglobin: 14.4 g/dL (ref 12.0–15.0)
MCH: 30.4 pg (ref 26.0–34.0)
MCHC: 34.1 g/dL (ref 30.0–36.0)
MCV: 89.2 fL (ref 80.0–100.0)
Platelets: 210 10*3/uL (ref 150–400)
RBC: 4.73 MIL/uL (ref 3.87–5.11)
RDW: 13.6 % (ref 11.5–15.5)
WBC: 5.1 10*3/uL (ref 4.0–10.5)
nRBC: 0 % (ref 0.0–0.2)

## 2020-08-22 LAB — BASIC METABOLIC PANEL
Anion gap: 7 (ref 5–15)
BUN: 22 mg/dL (ref 8–23)
CO2: 29 mmol/L (ref 22–32)
Calcium: 10.5 mg/dL — ABNORMAL HIGH (ref 8.9–10.3)
Chloride: 103 mmol/L (ref 98–111)
Creatinine, Ser: 0.93 mg/dL (ref 0.44–1.00)
GFR, Estimated: >60 mL/min (ref >60)
Glucose, Bld: 101 mg/dL — ABNORMAL HIGH (ref 70–99)
Potassium: 3.7 mmol/L (ref 3.5–5.1)
Sodium: 139 mmol/L (ref 135–145)

## 2020-08-22 LAB — SURGICAL PCR SCREEN
MRSA, PCR: NEGATIVE
Staphylococcus aureus: NEGATIVE

## 2020-08-22 NOTE — Progress Notes (Addendum)
COVID Vaccine Completed:Yes Date COVID test 08/29/20  PCP - Dr. Keturah Barre. [facility] [place] 01/18/20 Cardiologist - Dr. Zandra Abts  07/24/20-epic  Chest x-ray - no EKG - 04/14/20-epic Stress Test - no ECHO - 12/17/15-epic Cardiac Cath - no Pacemaker/ICD device last checked:NA  Sleep Study - no CPAP -   Fasting Blood Sugar - NA Checks Blood Sugar _____ times a day  Blood Thinner Instructions:Xarelto, ASA 81/ Dr. Zandra Abts Aspirin Instructions:Stop 5 days prior to DOS/ Dr. Harl Bowie Last Dose:08/29/20  Anesthesia review: yes  Patient denies shortness of breath, fever, cough and chest pain at PAT appointment Pt has no SOB with activities. Smells trigger her asthma and she uses an inhaler as needed.  Patient verbalized understanding of instructions that were given to them at the PAT appointment. Patient was also instructed that they will need to review over the PAT instructions again at home before surgery. Yes  Discrepancy noted with consent order. Dr. Luanna Cole scheduler, Mamie Levers  was called and the correct knee was verified with her. She said the order would be changed in Epic. The patient signed the consent for the right knee.

## 2020-08-25 NOTE — Care Plan (Signed)
Ortho Bundle Case Management Note  Patient Details  Name: Melissa Faulkner MRN: [id number] Date of Birth: 06/28/1945  Met with patient in the office prior to surgery. She will discharge to home with family. Has equipment at home. OPPT set up with [facility]. Patient and MD in agreement with plan. Choice offered.                     DME Arranged:    DME Agency:     HH Arranged:    HH Agency:     Additional Comments: Please contact me with any questions of if this plan should need to change.  Ladell Heads,  [facility]  [phone] 08/25/2020, 4:32 PM

## 2020-08-26 NOTE — Anesthesia Preprocedure Evaluation (Addendum)
Anesthesia Evaluation  Patient identified by MRN, date of birth, ID band Patient awake    Reviewed: Allergy & Precautions, NPO status , Patient's Chart, lab work & pertinent test results  Airway Mallampati: II  TM Distance: >3 FB Neck ROM: Full    Dental no notable dental hx.    Pulmonary asthma ,    Pulmonary exam normal breath sounds clear to auscultation       Cardiovascular hypertension, Pt. on home beta blockers and Pt. on medications Normal cardiovascular exam+ dysrhythmias + Valvular Problems/Murmurs MR  Rhythm:Regular Rate:Normal  Echo 2017 - Left ventricle: The cavity size was normal. Wall thickness was normal. Systolic function was normal. The estimated ejection fraction was in the range of 60% to 65%. Wall motion was normal; there were no regional wall motion abnormalities. The study isnot technically sufficient to allow evaluation of LV diastolicfunction.  - Mitral valve: Mildly calcified annulus. Mildly thickened leaflets. There was mild regurgitation.  - Left atrium: The atrium was moderately dilated.    Neuro/Psych negative neurological ROS     GI/Hepatic negative GI ROS, Neg liver ROS,   Endo/Other  Hypothyroidism   Renal/GU negative Renal ROS     Musculoskeletal  (+) Arthritis ,   Abdominal   Peds  Hematology negative hematology ROS (+)   Anesthesia Other Findings   Reproductive/Obstetrics negative OB ROS                           Anesthesia Physical Anesthesia Plan  ASA: 2  Anesthesia Plan: General   Post-op Pain Management:    Induction: Intravenous  PONV Risk Score and Plan: 3 and Ondansetron, Dexamethasone, Midazolam and Treatment may vary due to age or medical condition  Airway Management Planned: Oral ETT  Additional Equipment:   Intra-op Plan:   Post-operative Plan: Extubation in OR  Informed Consent: I have reviewed the patients History and  Physical, chart, labs and discussed the procedure including the risks, benefits and alternatives for the proposed anesthesia with the patient or authorized representative who has indicated his/her understanding and acceptance.     Dental advisory given  Plan Discussed with: CRNA  Anesthesia Plan Comments: (Per cardiology note 07/28/20, "Chart reviewed as part of pre-operative protocol coverage. Given past medical history and time since last visit, based on ACC/AHA guidelines, Melissa Faulkner would be at acceptable risk for the planned procedure without further cardiovascular testing.   Her RCRI is a class I risk, 0.4% risk of major cardiac event.  She may hold her Xarelto for 3 days prior to procedure.  Please resume as soon as hemostasis is achieved at the discretion of the surgeon.  She is able to complete greater than 4 METS of physical activity.  Patient was advised that if she develops new symptoms prior to surgery to contact our office to arrange a follow-up appointment.  She verbalized understanding.  I will route this recommendation to the requesting party via Epic fax function and remove from pre-op pool.")                                       Anesthesia Evaluation    Airway Mallampati: II  TM Distance: >3 FB Neck ROM: Full    Dental no notable dental hx.    Pulmonary asthma ,    Pulmonary exam normal breath sounds clear to auscultation  Cardiovascular hypertension, Pt. on medications Normal cardiovascular exam Rhythm:Regular Rate:Normal     Neuro/Psych    GI/Hepatic   Endo/Other  Hypothyroidism   Renal/GU      Musculoskeletal  (+) Arthritis , Osteoarthritis,    Abdominal   Peds  Hematology   Anesthesia Other Findings   Reproductive/Obstetrics                            Anesthesia Physical Anesthesia Plan  ASA: II  Anesthesia Plan: General   Post-op Pain Management:  Regional for Post-op pain    Induction: Intravenous  PONV Risk Score and Plan: 3 and Ondansetron, Dexamethasone, Midazolam and Treatment may vary due to age or medical condition  Airway Management Planned: Oral ETT  Additional Equipment:   Intra-op Plan:   Post-operative Plan: Extubation in OR  Informed Consent: I have reviewed the patients History and Physical, chart, labs and discussed the procedure including the risks, benefits and alternatives for the proposed anesthesia with the patient or authorized representative who has indicated his/her understanding and acceptance.     Dental advisory given  Plan Discussed with: CRNA  Anesthesia Plan Comments: (Per cardiology note 04/14/20, "Her revised cardiac risk index score is 0 placing the patient at a perioperative risk of major cardiac event at 0.4%.  Her Duke activity status index score is 42.7 giving the patient a functional capacity and METS at 7.99.  From a cardiac standpoint patient is cleared to undergo total left shoulder replacement under general anesthesia.  Per office protocol.  Patient can hold Xarelto for 3 days prior to procedure.")       Anesthesia Quick Evaluation  Anesthesia Quick Evaluation

## 2020-08-28 NOTE — H&P (Addendum)
KNEE ARTHROPLASTY ADMISSION H&P  Patient ID: Melissa Faulkner MRN: [id number] DOB/AGE: 01/13/1945 75 y.o.  Chief Complaint: right knee pain.  Planned Procedure Date: 09/02/20 Medical Clearance by Dr. Woody Seller Cardiac Clearance by Coletta Memos, NP  HPI: Melissa Faulkner is a 75 y.o. female who presents for evaluation of djd right knee. The patient has a history of pain and functional disability in the right knee due to arthritis and has failed non-surgical conservative treatments for greater than 12 weeks to include NSAID's and/or analgesics, corticosteriod injections, and activity modification.  Onset of symptoms was gradual, starting 2 years ago with gradually worsening course since that time. The patient noted prior procedures on the knee to include  arthroscopy and menisectomy on the right knee.  Patient currently rates pain at 8 out of 10 with activity. Patient has worsening of pain with activity and weight bearing and pain that interferes with activities of daily living.  Patient has evidence of joint space narrowing by imaging studies.  There is no active infection.  Past Medical History:  Diagnosis Date   Arthritis    Asthma    Atrial fibrillation ([place])    Heart murmur    noted in 71 -no echo ever done   Hyperlipidemia    Hypertension    Hypothyroidism    Skin cancer    R arm   Wears glasses    Past Surgical History:  Procedure Laterality Date   ABDOMINAL HYSTERECTOMY  2002   CARPAL TUNNEL RELEASE  2005   both rt/lt   COLONOSCOPY     GREAT TOE ARTHRODESIS, INTERPHALANGEAL JOINT  2014   right   GREAT TOE ARTHRODESIS, [place] PROCEDURE  2015   left   MENISCUS REPAIR  03/2019   PLANTAR FASCIA SURGERY  2015   left   SKIN CANCER EXCISION     R arm   TOTAL SHOULDER ARTHROPLASTY  2012   right-[facility]   TOTAL SHOULDER ARTHROPLASTY Left 05/06/2020   Procedure: TOTAL SHOULDER ARTHROPLASTY;  Surgeon: Marchia Bond, MD;  Location: [facility];  Service: Orthopedics;  Laterality: Left;    TUBAL LIGATION     Allergies  Allergen Reactions   Zestril [Lisinopril]     Lips swelled   Zocor [Simvastatin]     Leg cramps   Prior to Admission medications   Medication Sig Start Date End Date Taking? Authorizing Provider  amLODipine (NORVASC) 5 MG tablet Take 5 mg by mouth daily.   Yes [provider]  aspirin EC 81 MG tablet Take 81 mg by mouth daily. Swallow whole.   Yes [provider]  atorvastatin (LIPITOR) 40 MG tablet TAKE 1 TABLET BY MOUTH ONCE DAILY. Patient taking differently: Take 40 mg by mouth daily. 07/23/20  Yes BranchAlphonse Guild, MD  Calcium Carbonate-Vit D-Min (CALCIUM 1200 PO) Take 1,200 mg by mouth daily.   Yes [provider]  chlorthalidone (HYGROTON) 25 MG tablet Take 12.5 mg by mouth daily.   Yes [provider]  CRANBERRY PO Take 500 mg by mouth daily.   Yes [provider]  Cyanocobalamin (B-12) 2500 MCG TABS Take 2,500 mcg by mouth daily.   Yes [provider]  levothyroxine (SYNTHROID) 50 MCG tablet Take 50 mcg by mouth daily before breakfast.   Yes [provider]  metoprolol succinate (TOPROL-XL) 25 MG 24 hr tablet Take 1 tablet (25 mg total) by mouth daily. 12/25/18  Yes Herminio Commons, MD  Multiple Vitamins-Minerals (MULTIVITAMIN WITH MINERALS) tablet Take 1 tablet  by mouth daily.   Yes [provider]  Omega 3-6-9 Fatty Acids (OMEGA 3-6-9 COMPLEX PO) Take 1 capsule by mouth daily.   Yes [provider]  rivaroxaban (XARELTO) 20 MG TABS tablet TAKE (1) TABLET DAILY WITH SUPPER. Patient taking differently: Take 20 mg by mouth daily with supper. TAKE (1) TABLET DAILY WITH SUPPER. 01/24/20  Yes Branch, Alphonse Guild, MD  theophylline (UNIPHYL) 600 MG 24 hr tablet Take 300 mg by mouth 2 (two) times a day.  01/12/17  Yes [provider]  VENTOLIN HFA 108 (90 Base) MCG/ACT inhaler Inhale 2 puffs into the lungs every 4 (four) hours as needed for wheezing or shortness of  breath. 11/29/16  Yes [provider]  HYDROcodone-acetaminophen (NORCO) 10-325 MG tablet Take 1 tablet by mouth every 6 (six) hours as needed. Patient not taking: No sig reported 05/06/20   Merlene Pulling K, PA-C  ondansetron (ZOFRAN) 4 MG tablet Take 1 tablet (4 mg total) by mouth every 8 (eight) hours as needed for nausea or vomiting. Patient not taking: No sig reported 05/06/20   Ventura Bruns, PA-C   Social History   Socioeconomic History   Marital status: Married    Spouse name: Not on file   Number of children: Not on file   Years of education: Not on file   Highest education level: Not on file  Occupational History   Not on file  Tobacco Use   Smoking status: Never   Smokeless tobacco: Never  Vaping Use   Vaping Use: Never used  Substance and Sexual Activity   Alcohol use: Not Currently   Drug use: No   Sexual activity: Not on file  Other Topics Concern   Not on file  Social History Narrative   Not on file   Social Determinants of Health   Financial Resource Strain: Not on file  Food Insecurity: Not on file  Transportation Needs: Not on file  Physical Activity: Not on file  Stress: Not on file  Social Connections: Not on file   Family History  Problem Relation Age of Onset   Clotting disorder Mother        blood clot post surgery   Stomach cancer Father     ROS: Currently denies lightheadedness, dizziness, Fever, chills, CP, SOB.   No personal history of DVT, PE, MI, or CVA. No loose teeth or dentures All other systems have been reviewed and were otherwise currently negative with the exception of those mentioned in the HPI and as above.  Objective: Vitals: Ht: 5'4"  Wt: 153.7 lbs Temp: 98.1 BP: 157/79 Pulse: 70 O2 98% on room air.   Physical Exam: General: Alert, NAD.  Antalgic Gait  HEENT: EOMI, Good Neck Extension  Pulm: No increased work of breathing.  Clear B/L A/P w/o crackle or wheeze.  CV: RRR, No m/g/r appreciated  GI: soft, NT,  ND Neuro: Neuro without gross focal deficit.  Sensation intact distally Skin: No lesions in the area of chief complaint MSK/Surgical Site: right knee w/o redness or effusion.  medial JLT. ROM 0-115.  5/5 strength in extension and flexion.  +EHL/FHL.  NVI.  Stable varus and valgus stress.    Imaging Review Four views of the right knee taken 07-14-20 as well as the  previous MRI show a full thickness chondral loss in t the lateral compartment of her right knee as well as an inferior lateral meniscus tear and partial thickness chondral loss in the medial compartment.  Preoperative templating of the joint replacement has been completed, documented, and submitted to the Operating Room personnel in order to optimize intra-operative equipment management.  Assessment: djd right knee Principal Problem:   Osteoarthritis of right knee   Plan: Plan for Procedure(s): TOTAL KNEE ARTHROPLASTY  The patient history, physical exam, clinical judgement of the provider and imaging are consistent with end stage degenerative joint disease and total joint arthroplasty is deemed medically necessary. The treatment options including medical management, injection therapy, and arthroplasty were discussed at length. The risks and benefits of Procedure(s): TOTAL KNEE ARTHROPLASTY were presented and reviewed.  The risks of nonoperative treatment, versus surgical intervention including but not limited to continued pain, aseptic loosening, stiffness, dislocation/subluxation, infection, bleeding, nerve injury, blood clots, cardiopulmonary complications, morbidity, mortality, among others were discussed. The patient verbalizes understanding and wishes to proceed with the plan.  Patient is being admitted for inpatient treatment for surgery, pain control, PT, prophylactic antibiotics, VTE prophylaxis, progressive ambulation, ADL's and discharge planning.   Dental prophylaxis discussed and recommended for 2 years  postoperatively.  Her baseline Xarelto and aspirin  will be used postoperatively for DVT prophylaxis in addition to SCDs, and early ambulation. The patient is planning to be discharged home with OPPT in care of her husband    Ventura Bruns, Hershal Coria 08/28/2020 2:45 PM

## 2020-08-29 ENCOUNTER — Other Ambulatory Visit: Payer: Self-pay | Admitting: Orthopedic Surgery

## 2020-08-30 LAB — SARS CORONAVIRUS 2 (TAT 6-24 HRS): SARS Coronavirus 2: NEGATIVE

## 2020-09-02 ENCOUNTER — Ambulatory Visit (HOSPITAL_COMMUNITY)
Admission: RE | Admit: 2020-09-02 | Discharge: 2020-09-03 | Disposition: A | Discharge status: Home / Self Care | Payer: Medicare Other | Attending: Orthopedic Surgery | Admitting: Orthopedic Surgery

## 2020-09-02 ENCOUNTER — Encounter (HOSPITAL_COMMUNITY): Payer: Self-pay | Admitting: Orthopedic Surgery

## 2020-09-02 ENCOUNTER — Other Ambulatory Visit: Payer: Self-pay

## 2020-09-02 ENCOUNTER — Ambulatory Visit (HOSPITAL_COMMUNITY): Payer: Medicare Other | Admitting: Certified Registered Nurse Anesthetist

## 2020-09-02 ENCOUNTER — Ambulatory Visit (HOSPITAL_COMMUNITY): Payer: Medicare Other | Admitting: Physician Assistant

## 2020-09-02 ENCOUNTER — Ambulatory Visit (HOSPITAL_COMMUNITY): Payer: Medicare Other

## 2020-09-02 ENCOUNTER — Encounter (HOSPITAL_COMMUNITY)
Admission: RE | Disposition: A | Discharge status: Home / Self Care | Payer: Self-pay | Source: Home / Self Care | Attending: Orthopedic Surgery

## 2020-09-02 DIAGNOSIS — I10 Essential (primary) hypertension: Secondary | ICD-10-CM | POA: Diagnosis not present

## 2020-09-02 DIAGNOSIS — Z79890 Hormone replacement therapy (postmenopausal): Secondary | ICD-10-CM | POA: Diagnosis not present

## 2020-09-02 DIAGNOSIS — M1711 Unilateral primary osteoarthritis, right knee: Secondary | ICD-10-CM | POA: Diagnosis present

## 2020-09-02 DIAGNOSIS — E039 Hypothyroidism, unspecified: Secondary | ICD-10-CM | POA: Diagnosis not present

## 2020-09-02 DIAGNOSIS — Z85828 Personal history of other malignant neoplasm of skin: Secondary | ICD-10-CM | POA: Diagnosis not present

## 2020-09-02 DIAGNOSIS — Z471 Aftercare following joint replacement surgery: Secondary | ICD-10-CM | POA: Diagnosis not present

## 2020-09-02 DIAGNOSIS — Z888 Allergy status to other drugs, medicaments and biological substances status: Secondary | ICD-10-CM | POA: Insufficient documentation

## 2020-09-02 DIAGNOSIS — E785 Hyperlipidemia, unspecified: Secondary | ICD-10-CM | POA: Diagnosis not present

## 2020-09-02 DIAGNOSIS — Z96651 Presence of right artificial knee joint: Secondary | ICD-10-CM | POA: Diagnosis not present

## 2020-09-02 DIAGNOSIS — Z96612 Presence of left artificial shoulder joint: Secondary | ICD-10-CM | POA: Insufficient documentation

## 2020-09-02 DIAGNOSIS — Z96611 Presence of right artificial shoulder joint: Secondary | ICD-10-CM | POA: Diagnosis not present

## 2020-09-02 DIAGNOSIS — I4891 Unspecified atrial fibrillation: Secondary | ICD-10-CM | POA: Insufficient documentation

## 2020-09-02 DIAGNOSIS — Z7982 Long term (current) use of aspirin: Secondary | ICD-10-CM | POA: Insufficient documentation

## 2020-09-02 DIAGNOSIS — Z79899 Other long term (current) drug therapy: Secondary | ICD-10-CM | POA: Diagnosis not present

## 2020-09-02 DIAGNOSIS — G8918 Other acute postprocedural pain: Secondary | ICD-10-CM | POA: Diagnosis not present

## 2020-09-02 DIAGNOSIS — Z7901 Long term (current) use of anticoagulants: Secondary | ICD-10-CM | POA: Diagnosis not present

## 2020-09-02 DIAGNOSIS — M21061 Valgus deformity, not elsewhere classified, right knee: Secondary | ICD-10-CM | POA: Diagnosis not present

## 2020-09-02 HISTORY — PX: TOTAL KNEE ARTHROPLASTY: SHX125

## 2020-09-02 SURGERY — ARTHROPLASTY, KNEE, TOTAL
Anesthesia: General | Site: Knee | Laterality: Right

## 2020-09-02 MED ORDER — WATER FOR IRRIGATION, STERILE IR SOLN
Status: DC | PRN
  Administered 2020-09-02: 2000 mL

## 2020-09-02 MED ORDER — CHLORTHALIDONE 25 MG PO TABS
12.5000 mg | ORAL_TABLET | Freq: Every day | ORAL | Status: DC
  Administered 2020-09-03: 12.5 mg via ORAL
  Filled 2020-09-02: qty 1

## 2020-09-02 MED ORDER — PHENYLEPHRINE 40 MCG/ML (10ML) SYRINGE FOR IV PUSH (FOR BLOOD PRESSURE SUPPORT)
PREFILLED_SYRINGE | INTRAVENOUS | Status: AC
  Filled 2020-09-02: qty 10

## 2020-09-02 MED ORDER — BUPIVACAINE HCL 0.25 % IJ SOLN
INTRAMUSCULAR | Status: DC | PRN
  Administered 2020-09-02: 30 mL

## 2020-09-02 MED ORDER — ATORVASTATIN CALCIUM 40 MG PO TABS
40.0000 mg | ORAL_TABLET | Freq: Every day | ORAL | Status: DC
  Administered 2020-09-03: 40 mg via ORAL
  Filled 2020-09-02: qty 1

## 2020-09-02 MED ORDER — ONDANSETRON HCL 4 MG/2ML IJ SOLN
INTRAMUSCULAR | Status: AC
  Filled 2020-09-02: qty 2

## 2020-09-02 MED ORDER — ONDANSETRON HCL 4 MG/2ML IJ SOLN
INTRAMUSCULAR | Status: DC | PRN
  Administered 2020-09-02: 4 mg via INTRAVENOUS

## 2020-09-02 MED ORDER — ALBUTEROL SULFATE (2.5 MG/3ML) 0.083% IN NEBU
INHALATION_SOLUTION | RESPIRATORY_TRACT | Status: AC
  Filled 2020-09-02: qty 3

## 2020-09-02 MED ORDER — AMLODIPINE BESYLATE 5 MG PO TABS
5.0000 mg | ORAL_TABLET | Freq: Every day | ORAL | Status: DC
  Administered 2020-09-03: 5 mg via ORAL
  Filled 2020-09-02: qty 1

## 2020-09-02 MED ORDER — METOCLOPRAMIDE HCL 5 MG/ML IJ SOLN
5.0000 mg | Freq: Three times a day (TID) | INTRAMUSCULAR | Status: DC | PRN
  Administered 2020-09-02: 10 mg via INTRAVENOUS

## 2020-09-02 MED ORDER — POVIDONE-IODINE 7.5 % EX SOLN: Freq: Once | CUTANEOUS | Status: DC

## 2020-09-02 MED ORDER — MENTHOL 3 MG MT LOZG: 1.0000 | LOZENGE | OROMUCOSAL | Status: DC | PRN

## 2020-09-02 MED ORDER — LEVOTHYROXINE SODIUM 50 MCG PO TABS
50.0000 ug | ORAL_TABLET | Freq: Every day | ORAL | Status: DC
  Administered 2020-09-03: 50 ug via ORAL
  Filled 2020-09-02: qty 1

## 2020-09-02 MED ORDER — ACETAMINOPHEN 500 MG PO TABS
1000.0000 mg | ORAL_TABLET | Freq: Once | ORAL | Status: AC
  Administered 2020-09-02: 1000 mg via ORAL
  Filled 2020-09-02: qty 2

## 2020-09-02 MED ORDER — LACTATED RINGERS IV BOLUS
500.0000 mL | Freq: Once | INTRAVENOUS | Status: AC
  Administered 2020-09-02: 500 mL via INTRAVENOUS

## 2020-09-02 MED ORDER — EPHEDRINE SULFATE-NACL 50-0.9 MG/10ML-% IV SOSY
PREFILLED_SYRINGE | INTRAVENOUS | Status: DC | PRN
  Administered 2020-09-02: 5 mg via INTRAVENOUS

## 2020-09-02 MED ORDER — HYDROCODONE-ACETAMINOPHEN 5-325 MG PO TABS
ORAL_TABLET | ORAL | Status: AC
  Administered 2020-09-02: 2 via ORAL
  Filled 2020-09-02: qty 2

## 2020-09-02 MED ORDER — FENTANYL CITRATE (PF) 100 MCG/2ML IJ SOLN
INTRAMUSCULAR | Status: DC | PRN
  Administered 2020-09-02 (×4): 50 ug via INTRAVENOUS

## 2020-09-02 MED ORDER — KETOROLAC TROMETHAMINE 30 MG/ML IJ SOLN
INTRAMUSCULAR | Status: DC | PRN
  Administered 2020-09-02: 30 mg

## 2020-09-02 MED ORDER — EPHEDRINE 5 MG/ML INJ
INTRAVENOUS | Status: AC
  Filled 2020-09-02: qty 5

## 2020-09-02 MED ORDER — LACTATED RINGERS IV SOLN: INTRAVENOUS | Status: DC

## 2020-09-02 MED ORDER — MORPHINE SULFATE (PF) 2 MG/ML IV SOLN: 0.5000 mg | INTRAVENOUS | Status: DC | PRN

## 2020-09-02 MED ORDER — LIDOCAINE 2% (20 MG/ML) 5 ML SYRINGE
INTRAMUSCULAR | Status: DC | PRN
  Administered 2020-09-02: 40 mg via INTRAVENOUS

## 2020-09-02 MED ORDER — POTASSIUM CHLORIDE IN NACL 20-0.45 MEQ/L-% IV SOLN
INTRAVENOUS | Status: DC
  Filled 2020-09-02 (×2): qty 1000

## 2020-09-02 MED ORDER — ALBUTEROL SULFATE (2.5 MG/3ML) 0.083% IN NEBU
2.5000 mg | INHALATION_SOLUTION | Freq: Four times a day (QID) | RESPIRATORY_TRACT | Status: DC | PRN
Start: 2020-09-02 — End: 2020-09-02

## 2020-09-02 MED ORDER — METHOCARBAMOL 500 MG PO TABS
500.0000 mg | ORAL_TABLET | Freq: Four times a day (QID) | ORAL | Status: DC | PRN
  Administered 2020-09-03: 500 mg via ORAL
  Filled 2020-09-02: qty 1

## 2020-09-02 MED ORDER — LACTATED RINGERS IV BOLUS
250.0000 mL | Freq: Once | INTRAVENOUS | Status: AC
  Administered 2020-09-02: 250 mL via INTRAVENOUS

## 2020-09-02 MED ORDER — ROCURONIUM BROMIDE 10 MG/ML (PF) SYRINGE
PREFILLED_SYRINGE | INTRAVENOUS | Status: DC | PRN
  Administered 2020-09-02: 30 mg via INTRAVENOUS
  Administered 2020-09-02: 40 mg via INTRAVENOUS

## 2020-09-02 MED ORDER — PHENYLEPHRINE HCL-NACL 20-0.9 MG/250ML-% IV SOLN
INTRAVENOUS | Status: DC | PRN
  Administered 2020-09-02: 40 ug/min via INTRAVENOUS

## 2020-09-02 MED ORDER — DEXAMETHASONE SODIUM PHOSPHATE 4 MG/ML IJ SOLN
INTRAMUSCULAR | Status: DC | PRN
  Administered 2020-09-02: 4 mg via PERINEURAL

## 2020-09-02 MED ORDER — FLEET ENEMA 7-19 GM/118ML RE ENEM
1.0000 | ENEMA | Freq: Once | RECTAL | Status: DC | PRN
Start: 2020-09-02 — End: 2020-09-03

## 2020-09-02 MED ORDER — MIDAZOLAM HCL 2 MG/2ML IJ SOLN
INTRAMUSCULAR | Status: DC | PRN
  Administered 2020-09-02 (×2): 1 mg via INTRAVENOUS

## 2020-09-02 MED ORDER — POVIDONE-IODINE 10 % EX SWAB
2.0000 "application " | Freq: Once | CUTANEOUS | Status: AC
  Administered 2020-09-02: 2 via TOPICAL

## 2020-09-02 MED ORDER — PHENYLEPHRINE 40 MCG/ML (10ML) SYRINGE FOR IV PUSH (FOR BLOOD PRESSURE SUPPORT)
PREFILLED_SYRINGE | INTRAVENOUS | Status: DC | PRN
  Administered 2020-09-02 (×3): 120 ug via INTRAVENOUS

## 2020-09-02 MED ORDER — BUPIVACAINE-EPINEPHRINE (PF) 0.5% -1:200000 IJ SOLN
INTRAMUSCULAR | Status: DC | PRN
  Administered 2020-09-02: 20 mL via PERINEURAL

## 2020-09-02 MED ORDER — METOCLOPRAMIDE HCL 5 MG/ML IJ SOLN
INTRAMUSCULAR | Status: AC
  Filled 2020-09-02: qty 2

## 2020-09-02 MED ORDER — RIVAROXABAN 10 MG PO TABS: 20.0000 mg | ORAL_TABLET | Freq: Every day | ORAL | Status: DC

## 2020-09-02 MED ORDER — HYDROCODONE-ACETAMINOPHEN 7.5-325 MG PO TABS: 1.0000 | ORAL_TABLET | ORAL | Status: DC | PRN

## 2020-09-02 MED ORDER — HYDROCODONE-ACETAMINOPHEN 5-325 MG PO TABS: 1.0000 | ORAL_TABLET | ORAL | Status: DC | PRN

## 2020-09-02 MED ORDER — ACETAMINOPHEN 325 MG PO TABS: 325.0000 mg | ORAL_TABLET | Freq: Four times a day (QID) | ORAL | Status: DC | PRN

## 2020-09-02 MED ORDER — DEXAMETHASONE SODIUM PHOSPHATE 10 MG/ML IJ SOLN
INTRAMUSCULAR | Status: AC
  Filled 2020-09-02: qty 1

## 2020-09-02 MED ORDER — ROCURONIUM BROMIDE 10 MG/ML (PF) SYRINGE
PREFILLED_SYRINGE | INTRAVENOUS | Status: AC
  Filled 2020-09-02: qty 10

## 2020-09-02 MED ORDER — FENTANYL CITRATE (PF) 100 MCG/2ML IJ SOLN
INTRAMUSCULAR | Status: AC
  Filled 2020-09-02: qty 2

## 2020-09-02 MED ORDER — HYDROMORPHONE HCL 1 MG/ML IJ SOLN: 0.2500 mg | INTRAMUSCULAR | Status: DC | PRN

## 2020-09-02 MED ORDER — CEFAZOLIN SODIUM-DEXTROSE 2-4 GM/100ML-% IV SOLN
INTRAVENOUS | Status: AC
  Administered 2020-09-02: 2 g via INTRAVENOUS
  Filled 2020-09-02: qty 100

## 2020-09-02 MED ORDER — DEXAMETHASONE SODIUM PHOSPHATE 4 MG/ML IJ SOLN
INTRAMUSCULAR | Status: DC | PRN
  Administered 2020-09-02: 5 mg via INTRAVENOUS

## 2020-09-02 MED ORDER — BUPIVACAINE HCL (PF) 0.25 % IJ SOLN
INTRAMUSCULAR | Status: AC
  Filled 2020-09-02: qty 30

## 2020-09-02 MED ORDER — ORAL CARE MOUTH RINSE: 15.0000 mL | Freq: Once | OROMUCOSAL | Status: AC

## 2020-09-02 MED ORDER — METOCLOPRAMIDE HCL 5 MG PO TABS
5.0000 mg | ORAL_TABLET | Freq: Three times a day (TID) | ORAL | Status: DC | PRN
  Filled 2020-09-02: qty 2

## 2020-09-02 MED ORDER — THEOPHYLLINE ER 400 MG PO TB24
600.0000 mg | ORAL_TABLET | Freq: Every day | ORAL | Status: DC
  Filled 2020-09-02: qty 1.5

## 2020-09-02 MED ORDER — TRANEXAMIC ACID-NACL 1000-0.7 MG/100ML-% IV SOLN
1000.0000 mg | INTRAVENOUS | Status: AC
  Administered 2020-09-02: 1000 mg via INTRAVENOUS
  Filled 2020-09-02: qty 100

## 2020-09-02 MED ORDER — MIDAZOLAM HCL 2 MG/2ML IJ SOLN
INTRAMUSCULAR | Status: AC
  Filled 2020-09-02: qty 2

## 2020-09-02 MED ORDER — POLYETHYLENE GLYCOL 3350 17 G PO PACK: 17.0000 g | PACK | Freq: Every day | ORAL | Status: DC | PRN

## 2020-09-02 MED ORDER — DIPHENHYDRAMINE HCL 12.5 MG/5ML PO ELIX: 12.5000 mg | ORAL_SOLUTION | ORAL | Status: DC | PRN

## 2020-09-02 MED ORDER — ACETAMINOPHEN 500 MG PO TABS
500.0000 mg | ORAL_TABLET | Freq: Four times a day (QID) | ORAL | Status: DC
  Administered 2020-09-02 – 2020-09-03 (×3): 500 mg via ORAL
  Filled 2020-09-02 (×3): qty 1

## 2020-09-02 MED ORDER — PROPOFOL 10 MG/ML IV BOLUS
INTRAVENOUS | Status: DC | PRN
  Administered 2020-09-02: 160 mg via INTRAVENOUS

## 2020-09-02 MED ORDER — CEFAZOLIN SODIUM-DEXTROSE 2-4 GM/100ML-% IV SOLN
2.0000 g | INTRAVENOUS | Status: AC
  Administered 2020-09-02: 2 g via INTRAVENOUS
  Filled 2020-09-02: qty 100

## 2020-09-02 MED ORDER — ASPIRIN EC 81 MG PO TBEC
81.0000 mg | DELAYED_RELEASE_TABLET | Freq: Every day | ORAL | Status: DC
  Administered 2020-09-03: 81 mg via ORAL
  Filled 2020-09-02: qty 1

## 2020-09-02 MED ORDER — 0.9 % SODIUM CHLORIDE (POUR BTL) OPTIME
TOPICAL | Status: DC | PRN
  Administered 2020-09-02: 1000 mL

## 2020-09-02 MED ORDER — CEFAZOLIN SODIUM-DEXTROSE 2-4 GM/100ML-% IV SOLN
2.0000 g | Freq: Four times a day (QID) | INTRAVENOUS | Status: AC
Start: 2020-09-02 — End: 2020-09-02
  Administered 2020-09-02: 2 g via INTRAVENOUS
  Filled 2020-09-02: qty 100

## 2020-09-02 MED ORDER — ONDANSETRON HCL 4 MG PO TABS
4.0000 mg | ORAL_TABLET | Freq: Four times a day (QID) | ORAL | Status: DC | PRN
  Filled 2020-09-02: qty 1

## 2020-09-02 MED ORDER — PHENOL 1.4 % MT LIQD: 1.0000 | OROMUCOSAL | Status: DC | PRN

## 2020-09-02 MED ORDER — MEPERIDINE HCL 50 MG/ML IJ SOLN: 6.2500 mg | INTRAMUSCULAR | Status: DC | PRN

## 2020-09-02 MED ORDER — POVIDONE-IODINE 10 % EX SWAB: 2.0000 "application " | Freq: Once | CUTANEOUS | Status: DC

## 2020-09-02 MED ORDER — DEXAMETHASONE SODIUM PHOSPHATE 10 MG/ML IJ SOLN
10.0000 mg | Freq: Once | INTRAMUSCULAR | Status: AC
  Administered 2020-09-03: 10 mg via INTRAVENOUS
  Filled 2020-09-02: qty 1

## 2020-09-02 MED ORDER — CHLORHEXIDINE GLUCONATE 0.12 % MT SOLN
15.0000 mL | Freq: Once | OROMUCOSAL | Status: AC
  Administered 2020-09-02: 15 mL via OROMUCOSAL

## 2020-09-02 MED ORDER — KETOROLAC TROMETHAMINE 30 MG/ML IJ SOLN
INTRAMUSCULAR | Status: AC
  Filled 2020-09-02: qty 1

## 2020-09-02 MED ORDER — DOCUSATE SODIUM 100 MG PO CAPS
100.0000 mg | ORAL_CAPSULE | Freq: Two times a day (BID) | ORAL | Status: DC
  Administered 2020-09-03: 100 mg via ORAL
  Filled 2020-09-02 (×2): qty 1

## 2020-09-02 MED ORDER — ALBUTEROL SULFATE (2.5 MG/3ML) 0.083% IN NEBU: 3.0000 mL | INHALATION_SOLUTION | RESPIRATORY_TRACT | Status: DC | PRN

## 2020-09-02 MED ORDER — METOPROLOL SUCCINATE ER 25 MG PO TB24
25.0000 mg | ORAL_TABLET | Freq: Every day | ORAL | Status: DC
  Administered 2020-09-03: 25 mg via ORAL
  Filled 2020-09-02: qty 1

## 2020-09-02 MED ORDER — PROMETHAZINE HCL 25 MG/ML IJ SOLN: 6.2500 mg | INTRAMUSCULAR | Status: DC | PRN

## 2020-09-02 MED ORDER — METHOCARBAMOL 500 MG IVPB - SIMPLE MED
500.0000 mg | Freq: Four times a day (QID) | INTRAVENOUS | Status: DC | PRN
  Administered 2020-09-02: 500 mg via INTRAVENOUS
  Filled 2020-09-02: qty 50

## 2020-09-02 MED ORDER — SODIUM CHLORIDE 0.9 % IR SOLN
Status: DC | PRN
  Administered 2020-09-02: 3000 mL

## 2020-09-02 MED ORDER — FENTANYL CITRATE PF 50 MCG/ML IJ SOSY
PREFILLED_SYRINGE | INTRAMUSCULAR | Status: AC
  Filled 2020-09-02: qty 1

## 2020-09-02 MED ORDER — ONDANSETRON HCL 4 MG/2ML IJ SOLN
4.0000 mg | Freq: Four times a day (QID) | INTRAMUSCULAR | Status: DC | PRN
  Administered 2020-09-02 (×2): 4 mg via INTRAVENOUS
  Filled 2020-09-02: qty 2

## 2020-09-02 MED ORDER — METHOCARBAMOL 500 MG IVPB - SIMPLE MED
INTRAVENOUS | Status: AC
  Filled 2020-09-02: qty 50

## 2020-09-02 MED ORDER — SUGAMMADEX SODIUM 200 MG/2ML IV SOLN
INTRAVENOUS | Status: DC | PRN
Start: 2020-09-02 — End: 2020-09-02
  Administered 2020-09-02: 200 mg via INTRAVENOUS

## 2020-09-02 MED ORDER — BISACODYL 10 MG RE SUPP: 10.0000 mg | Freq: Every day | RECTAL | Status: DC | PRN

## 2020-09-02 MED ORDER — CLONIDINE HCL (ANALGESIA) 100 MCG/ML EP SOLN
EPIDURAL | Status: DC | PRN
  Administered 2020-09-02: 70 ug

## 2020-09-02 MED ORDER — LIDOCAINE 2% (20 MG/ML) 5 ML SYRINGE
INTRAMUSCULAR | Status: AC
  Filled 2020-09-02: qty 5

## 2020-09-02 MED ORDER — ALUM & MAG HYDROXIDE-SIMETH 200-200-20 MG/5ML PO SUSP: 30.0000 mL | ORAL | Status: DC | PRN

## 2020-09-02 SURGICAL SUPPLY — 57 items
ATTUNE PS FEM RT SZ 5 CEM KNEE (Femur) ×2 IMPLANT
BAG COUNTER SPONGE SURGICOUNT (BAG) ×2 IMPLANT
BAG ZIPLOCK 12X15 (MISCELLANEOUS) IMPLANT
BASEPLATE TIB CMT FB PCKT SZ5 (Knees) ×2 IMPLANT
BLADE SAG 18X100X1.27 (BLADE) ×2 IMPLANT
BLADE SAW SGTL 11.0X1.19X90.0M (BLADE) ×2 IMPLANT
BLADE SAW SGTL 13X75X1.27 (BLADE) ×2 IMPLANT
BLADE SURG 15 STRL LF DISP TIS (BLADE) ×1 IMPLANT
BLADE SURG 15 STRL SS (BLADE) ×1
BNDG ELASTIC 6X10 VLCR STRL LF (GAUZE/BANDAGES/DRESSINGS) ×2 IMPLANT
BNDG ELASTIC 6X15 VLCR STRL LF (GAUZE/BANDAGES/DRESSINGS) ×2 IMPLANT
BOWL SMART MIX CTS (DISPOSABLE) ×2 IMPLANT
CEMENT HV SMART SET (Cement) ×4 IMPLANT
CLSR STERI-STRIP ANTIMIC 1/2X4 (GAUZE/BANDAGES/DRESSINGS) ×4 IMPLANT
COVER SURGICAL LIGHT HANDLE (MISCELLANEOUS) ×2 IMPLANT
CUFF TOURN SGL QUICK 34 (TOURNIQUET CUFF) ×1
CUFF TRNQT CYL 34X4.125X (TOURNIQUET CUFF) ×1 IMPLANT
DECANTER SPIKE VIAL GLASS SM (MISCELLANEOUS) IMPLANT
DRAPE SHEET LG 3/4 BI-LAMINATE (DRAPES) ×2 IMPLANT
DRAPE U-SHAPE 47X51 STRL (DRAPES) ×2 IMPLANT
DRSG MEPILEX BORDER 4X12 (GAUZE/BANDAGES/DRESSINGS) ×2 IMPLANT
DRSG PAD ABDOMINAL 8X10 ST (GAUZE/BANDAGES/DRESSINGS) ×2 IMPLANT
DURAPREP 26ML APPLICATOR (WOUND CARE) ×4 IMPLANT
ELECT REM PT RETURN 15FT ADLT (MISCELLANEOUS) ×2 IMPLANT
GLOVE SRG 8 PF TXTR STRL LF DI (GLOVE) ×1 IMPLANT
GLOVE SURG ENC MOIS LTX SZ6.5 (GLOVE) ×2 IMPLANT
GLOVE SURG ENC MOIS LTX SZ7.5 (GLOVE) ×2 IMPLANT
GLOVE SURG UNDER POLY LF SZ7 (GLOVE) ×2 IMPLANT
GLOVE SURG UNDER POLY LF SZ8 (GLOVE) ×1
GOWN STRL REUS W/ TWL LRG LVL3 (GOWN DISPOSABLE) ×2 IMPLANT
GOWN STRL REUS W/TWL LRG LVL3 (GOWN DISPOSABLE) ×2
HANDPIECE INTERPULSE COAX TIP (DISPOSABLE) ×1
HOLDER FOLEY CATH W/STRAP (MISCELLANEOUS) IMPLANT
HOOD PEEL AWAY FLYTE STAYCOOL (MISCELLANEOUS) ×6 IMPLANT
IMMOBILIZER KNEE 20 (SOFTGOODS) ×2
IMMOBILIZER KNEE 20 THIGH 36 (SOFTGOODS) ×1 IMPLANT
INSERT TIB FIX BEARNG SZ 5 5MM (Insert) ×2 IMPLANT
KIT TURNOVER KIT A (KITS) ×2 IMPLANT
MANIFOLD NEPTUNE II (INSTRUMENTS) ×2 IMPLANT
NS IRRIG 1000ML POUR BTL (IV SOLUTION) ×2 IMPLANT
PACK ICE MAXI GEL EZY WRAP (MISCELLANEOUS) ×2 IMPLANT
PACK TOTAL KNEE CUSTOM (KITS) ×2 IMPLANT
PATELLA MEDIAL ATTUN 35MM KNEE (Knees) ×2 IMPLANT
PENCIL SMOKE EVACUATOR (MISCELLANEOUS) ×2 IMPLANT
PIN DRILL FIX HALF THREAD (BIT) ×2 IMPLANT
PIN STEINMAN FIXATION KNEE (PIN) ×2 IMPLANT
PROTECTOR NERVE ULNAR (MISCELLANEOUS) ×2 IMPLANT
SET HNDPC FAN SPRY TIP SCT (DISPOSABLE) ×1 IMPLANT
SET PAD KNEE POSITIONER (MISCELLANEOUS) ×2 IMPLANT
STRIP CLOSURE SKIN 1/2X4 (GAUZE/BANDAGES/DRESSINGS) ×2 IMPLANT
SUT VIC AB 1 CT1 36 (SUTURE) ×4 IMPLANT
SUT VIC AB 2-0 CT1 27 (SUTURE) ×1
SUT VIC AB 2-0 CT1 TAPERPNT 27 (SUTURE) ×1 IMPLANT
SUT VIC AB 3-0 SH 8-18 (SUTURE) ×2 IMPLANT
TRAY FOLEY MTR SLVR 16FR STAT (SET/KITS/TRAYS/PACK) IMPLANT
TUBE SUCTION HIGH CAP CLEAR NV (SUCTIONS) ×2 IMPLANT
WATER STERILE IRR 1000ML POUR (IV SOLUTION) ×4 IMPLANT

## 2020-09-02 NOTE — Anesthesia Postprocedure Evaluation (Addendum)
Anesthesia Post Note  Patient: Melissa Faulkner  Procedure(s) Performed: TOTAL KNEE ARTHROPLASTY (Right: Knee)     Patient location during evaluation: PACU Anesthesia Type: General Level of consciousness: sedated and patient cooperative Pain management: pain level controlled Vital Signs Assessment: post-procedure vital signs reviewed and stable Respiratory status: spontaneous breathing Cardiovascular status: stable Anesthetic complications: no Comments: Called by RN in Phase II at 1507. I had seen [name] in phase I hours before and she was recovering well. I had received no communication she was having issues until 78. She apparently had become nauseated and vomited 3 times at this point. The RN stated she had given her zofran and reglan, neither of which I had ordered. I spoke to the patient, and she seemed okay with potentially staying overnight. I spoke to the RN and requested let the surgery service know she's having frequent nausea and vomiting spells and would likely benefit from overnight observation.   No notable events documented.  Last Vitals:  Vitals:   09/02/20 1248 09/02/20 1348  BP: 104/74 107/70  Pulse: 62 75  Resp:  12  Temp:    SpO2: 98% 100%    Last Pain:  Vitals:   09/02/20 1348  TempSrc:   PainSc: 0-No pain                 Nolon Nations

## 2020-09-02 NOTE — Evaluation (Signed)
Physical Therapy Evaluation Patient Details Name: Melissa Faulkner MRN: [id number] DOB: 1945/08/19 Today's Date: 09/02/2020   History of Present Illness   Patient is 75 y.o. female s/p Rt TKA on 09/02/20 with PMH significant for OA, asthma, Afib, HLD, HTN, hypothyroidism, great toe repair, Lt TSA.    Clinical Impression  Melissa Faulkner is a 75 y.o. female POD 0 s/p Rt TKA. Patient reports independence with mobility at baseline. Patient is now limited by functional impairments (see PT problem list below) and requires min guard for transfers and gait with RW. Patient was able to ambulate ~100 feet with RW and min guard and cues for safe walker management. Patient educated on safe sequencing for stair mobility and verbalized safe guarding position for people assisting with mobility. Patient instructed in exercises to facilitate ROM and circulation. Patient limited this session by nausea and emesis 2x, however VSS and pt reports she has had this after surgery before. RN aware. Patient will benefit from continued skilled PT interventions to address impairments and progress towards PLOF. Acute PT will follow to progress mobility and stair training in preparation for safe discharge home.       09/02/20 1400  PT Visit Information  Last PT Received On 09/02/20  Assistance Needed +1  History of Present Illness Patient is 75 y.o. female s/p Rt TKA on 09/02/20 with PMH significant for OA, asthma, Afib, HLD, HTN, hypothyroidism, great toe repair, Lt TSA.  Precautions  Precautions Fall  Restrictions  Weight Bearing Restrictions No  Other Position/Activity Restrictions WBAT  Home Living  Family/patient expects to be discharged to: Private residence  Living Arrangements Spouse/significant other  Available Help at Discharge Family;Available 24 hours/day  Type of [place] to enter  Entrance Stairs-Number of Steps 1  Entrance Stairs-Rails None  Home Layout One level  Bathroom Shower/Tub  Tub/shower unit  Bathroom Toilet Handicapped height  Bathroom Accessibility Yes  Home Equipment Shower seat  Prior Function  Level of Independence Independent  Communication  Communication No difficulties  Pain Assessment  Pain Assessment No/denies pain  Cognition  Arousal/Alertness Awake/alert  Behavior During Therapy WFL for tasks assessed/performed  Overall Cognitive Status Within Functional Limits for tasks assessed  Upper Extremity Assessment  Upper Extremity Assessment Overall WFL for tasks assessed  Lower Extremity Assessment  Lower Extremity Assessment Overall WFL for tasks assessed;RLE deficits/detail  RLE Deficits / Details good quad activation, not limited by pain and no extensor lag with SLR  RLE Sensation WNL  RLE Coordination WNL  Cervical / Trunk Assessment  Cervical / Trunk Assessment Normal  Bed Mobility  Overal bed mobility Needs Assistance  Bed Mobility Supine to Sit  Supine to sit Min guard;Supervision  General bed mobility comments pt taking extra time to sit up EOB, able to bring LE's off EOB without assist.  Transfers  Overall transfer level Needs assistance  Equipment used Rolling walker (2 wheeled)  Transfers Sit to/from Stand  Sit to Stand Min guard  General transfer comment cues for safe hand placement for power up and no assist needed to rise. guarding for safety.  Ambulation/Gait  Ambulation/Gait assistance Min guard  Gait Distance (Feet) 100 Feet  Assistive device Rolling walker (2 wheeled)  Gait Pattern/deviations Step-to pattern;Decreased stride length;Decreased weight shift to right;Antalgic  General Gait Details cues for step to pattern and proximity of RW. pt maintained throughout with no overt LOB. pt with overall slow cautious gait.  Gait velocity decr  Stairs Yes  Stairs  assistance Min guard  Stair Management With walker;Step to pattern;Forwards  Number of Stairs 1  General stair comments cues for step sequencing to ascend/descend  curb with RW. no buckling at Rt knee and no LOB. pt verbvalized safe guarding for family to provide.  Balance  Overall balance assessment Needs assistance  Sitting-balance support Feet supported  Sitting balance-Leahy Scale Good  Standing balance support During functional activity;Bilateral upper extremity supported  Standing balance-Leahy Scale Fair  PT - End of Session  Equipment Utilized During Treatment Gait belt  Activity Tolerance Patient tolerated treatment well  Patient left in chair;with call bell/phone within reach  Nurse Communication Mobility status  PT Assessment  PT Recommendation/Assessment Patient needs continued PT services  PT Visit Diagnosis Muscle weakness (generalized) (M62.81);Difficulty in walking, not elsewhere classified (R26.2)  PT Problem List Decreased strength;Decreased range of motion;Decreased activity tolerance;Decreased balance;Decreased mobility;Decreased knowledge of use of DME;Decreased knowledge of precautions  PT Plan  PT Frequency (ACUTE ONLY) 7X/week  PT Treatment/Interventions (ACUTE ONLY) Gait training;DME instruction;Stair training;Functional mobility training;Therapeutic activities;Therapeutic exercise;Balance training;Patient/family education  AM-PAC PT "6 Clicks" Mobility Outcome Measure (Version 2)  Help needed turning from your back to your side while in a flat bed without using bedrails? 3  Help needed moving from lying on your back to sitting on the side of a flat bed without using bedrails? 3  Help needed moving to and from a bed to a chair (including a wheelchair)? 3  Help needed standing up from a chair using your arms (e.g., wheelchair or bedside chair)? 3  Help needed to walk in hospital room? 3  Help needed climbing 3-5 steps with a railing?  3  6 Click Score 18  Consider Recommendation of Discharge To: Home with [facility]  Progressive Mobility  What is the highest level of mobility based on the progressive mobility assessment? Level 5  (Walks with assist in room/hall) - Balance while stepping forward/back and can walk in room with assist - Complete  Mobility Ambulated with assistance in room  PT Recommendation  Follow Up Recommendations Follow surgeon's recommendation for DC plan and follow-up therapies  PT equipment None recommended by PT  Individuals Consulted  Consulted and Agree with Results and Recommendations Patient  Acute Rehab PT Goals  Patient Stated Goal get home and recover independence  PT Goal Formulation With patient  Time For Goal Achievement 09/09/20  Potential to Achieve Goals Good  PT Time Calculation  PT Start Time (ACUTE ONLY) 1420  PT Stop Time (ACUTE ONLY) 1506  PT Time Calculation (min) (ACUTE ONLY) 46 min  PT General Charges  $$ ACUTE PT VISIT 1 Visit  PT Evaluation  $PT Eval Low Complexity 1 Low  PT Treatments  $Gait Training 8-22 mins  $Therapeutic Exercise 8-22 mins  Written Expression  Dominant Hand Right    Verner Mould, DPT Acute Rehabilitation Services Office [phone] Pager [phone]  Jacques Navy 09/02/2020, 6:01 PM

## 2020-09-02 NOTE — Interval H&P Note (Signed)
History and Physical Interval Note:  09/02/2020 7:13 AM  Melissa Faulkner  has presented today for surgery, with the diagnosis of djd right knee.  The various methods of treatment have been discussed with the patient and family. After consideration of risks, benefits and other options for treatment, the patient has consented to  Procedure(s): TOTAL KNEE ARTHROPLASTY (Right) as a surgical intervention.  The patient's history has been reviewed, patient examined, no change in status, stable for surgery.  I have reviewed the patient's chart and labs.  Questions were answered to the patient's satisfaction.     [name]

## 2020-09-02 NOTE — Transfer of Care (Signed)
Immediate Anesthesia Transfer of Care Note  Patient: SHAELAN ODOWD  Procedure(s) Performed: TOTAL KNEE ARTHROPLASTY (Right: Knee)  Patient Location: PACU  Anesthesia Type:GA combined with regional for post-op pain  Level of Consciousness: awake, alert , oriented and patient cooperative  Airway & Oxygen Therapy: Patient Spontanous Breathing and Patient connected to face mask  Post-op Assessment: Report given to RN and Post -op Vital signs reviewed and stable  Post vital signs: Reviewed and stable  Last Vitals:  Vitals Value Taken Time  BP 113/71 09/02/20 1009  Temp    Pulse 72 09/02/20 1010  Resp 16 09/02/20 1010  SpO2 100 % 09/02/20 1010  Vitals shown include unvalidated device data.  Last Pain:  Vitals:   09/02/20 0532  TempSrc: Oral         Complications: No notable events documented.

## 2020-09-02 NOTE — Op Note (Signed)
DATE OF SURGERY:  09/02/2020 TIME: 9:28 AM  PATIENT NAME:  Melissa Faulkner   AGE: 75 y.o.    PRE-OPERATIVE DIAGNOSIS: Right knee primary localized osteoarthritis, valgus  POST-OPERATIVE DIAGNOSIS:  Same  PROCEDURE: Right total Knee Arthroplasty  SURGEON:  [name], MD   ASSISTANT:  Merlene Pulling, PA-C, present and scrubbed throughout the case, critical for assistance with exposure, retraction, instrumentation, and closure.   OPERATIVE IMPLANTS: Depuy Attune size 5 posterior Stabilized Femur, with a size 5 fixed Bearing Tibia, 5 polyethylene insert with a 35 medialized oval dome polyethylene patella.  PREOPERATIVE INDICATIONS:  Melissa Faulkner is a 75 y.o. year old female with end stage bone on bone degenerative arthritis of the knee who failed conservative treatment, including injections, antiinflammatories, activity modification, and assistive devices, and had significant impairment of their activities of daily living, and elected for Total Knee Arthroplasty.   The risks, benefits, and alternatives were discussed at length including but not limited to the risks of infection, bleeding, nerve injury, stiffness, blood clots, the need for revision surgery, cardiopulmonary complications, among others, and they were willing to proceed.  OPERATIVE FINDINGS AND UNIQUE ASPECTS OF THE CASE: The patellofemoral joint and medial compartment were in good condition but the lateral compartment had extensive grade 4 chondral loss both on the tibia and the femur.  I cut the femur twice, in order to have appropriate gap.  I had a little bit of a challenge with the box cut, getting the femoral trial to see it completely, but ultimately I felt satisfied that I had appropriate position.  ESTIMATED BLOOD LOSS: 100 mL  OPERATIVE DESCRIPTION:  The patient was brought to the operative room and placed in a supine position.  Anesthesia was administered.  IV antibiotics were given.  The lower extremity was  prepped and draped in the usual sterile fashion.  Time out was performed.  The leg was elevated and exsanguinated and the tourniquet was inflated.  Anterior quadriceps tendon splitting approach was performed.  The patella was everted and osteophytes were removed.  The anterior horn of the medial and lateral meniscus was removed.   The patella was then measured, and cut with the saw.  The thickness before the cut was 23 and after the cut was 13.5.  A metal shield was used to protect the patella throughout the case.    The distal femur was opened with the drill and the intramedullary distal femoral cutting jig was utilized, set at 5 degrees resecting 9 mm off the distal femur.  Care was taken to protect the collateral ligaments.  Then the extramedullary tibial cutting jig was utilized making the appropriate cut using the anterior tibial crest as a reference building in appropriate posterior slope.  Care was taken during the cut to protect the medial and collateral ligaments.  The proximal tibia was removed along with the posterior horns of the menisci.  The PCL was sacrificed.    The extensor gap was measured and found to have adequate resection, measuring to a size 5, although I did have to cut the femur a second time in order to achieve the appropriate gap.  My tibial cut was fairly minimal laterally.  This was set at 2 mm off of the worn side.  The distal femoral sizing jig was applied, taking care to avoid notching.  This was set at 3 degrees of external rotation.  Then the 4-in-1 cutting jig was applied and the anterior and posterior femur was cut,  along with the chamfer cuts.  All posterior osteophytes were removed.  The flexion gap was then measured and was symmetric with the extension gap.  I completed the distal femoral preparation using the appropriate jig to prepare the box.  The proximal tibia sized and prepared accordingly with the reamer and the punch, and then all components were trialed  with the poly insert.  The knee was found to have excellent balance and full motion.    The above named components were then cemented into place and all excess cement was removed.  The real polyethylene implant was placed.  After the cement had cured I released the tourniquet and confirmed excellent hemostasis with no major posterior vessel injury.    The knee was easily taken through a range of motion and the patella tracked well and the knee irrigated copiously and the parapatellar and subcutaneous tissue closed with vicryl, and monocryl with steri strips for the skin.  The wounds were injected with marcaine, and dressed with sterile gauze and the patient was awakened and returned to the PACU in stable and satisfactory condition.  There were no complications.  Total tourniquet time was 70 minutes.

## 2020-09-02 NOTE — Plan of Care (Signed)
  Problem: Pain Management: Goal: Pain level will decrease with appropriate interventions Outcome: Progressing   Problem: Activity: Goal: Ability to avoid complications of mobility impairment will improve Outcome: Progressing Goal: Range of joint motion will improve Outcome: Progressing

## 2020-09-02 NOTE — Discharge Instructions (Signed)

## 2020-09-02 NOTE — Anesthesia Procedure Notes (Signed)
Procedure Name: Intubation Date/Time: 09/02/2020 7:39 AM Performed by: Claudia Desanctis, CRNA Pre-anesthesia Checklist: Patient identified, Emergency Drugs available, Suction available and Patient being monitored Patient Re-evaluated:Patient Re-evaluated prior to induction Oxygen Delivery Method: Circle system utilized Preoxygenation: Pre-oxygenation with 100% oxygen Induction Type: IV induction Ventilation: Mask ventilation without difficulty Laryngoscope Size: 2 and Miller Grade View: Grade I Tube type: Oral Tube size: 7.0 mm Number of attempts: 1 Airway Equipment and Method: Stylet Placement Confirmation: ETT inserted through vocal cords under direct vision, positive ETCO2 and breath sounds checked- equal and bilateral Tube secured with: Tape Dental Injury: Teeth and Oropharynx as per pre-operative assessment

## 2020-09-02 NOTE — Anesthesia Procedure Notes (Signed)
Anesthesia Regional Block: Adductor canal block   Pre-Anesthetic Checklist: , timeout performed,  Correct Patient, Correct Site, Correct Laterality,  Correct Procedure, Correct Position, site marked,  Risks and benefits discussed,  Surgical consent,  Pre-op evaluation,  At surgeon's request and post-op pain management  Laterality: Lower  Prep: chloraprep       Needles:  Injection technique: Single-shot  Needle Type: Stimiplex     Needle Length: 9cm  Needle Gauge: 21     Additional Needles:   Procedures:,,,, ultrasound used (permanent image in chart),,    Narrative:  Start time: 09/02/2020 7:00 AM End time: 09/02/2020 7:20 AM Injection made incrementally with aspirations every 5 mL.  Performed by: Personally  Anesthesiologist: Nolon Nations, MD  Additional Notes: BP cuff, EKG monitors applied. Sedation begun. Artery and nerve location verified with ultrasound. Anesthetic injected incrementally (79m), slowly, and after negative aspirations under direct u/s guidance. Good fascial/perineural spread. Tolerated well.

## 2020-09-02 NOTE — Addendum Note (Signed)
Addendum  created 09/02/20 1551 by Nolon Nations, MD   Clinical Note Signed, Pharmacy for encounter modified

## 2020-09-03 ENCOUNTER — Encounter (HOSPITAL_COMMUNITY): Payer: Self-pay | Admitting: Orthopedic Surgery

## 2020-09-03 DIAGNOSIS — I4891 Unspecified atrial fibrillation: Secondary | ICD-10-CM | POA: Diagnosis not present

## 2020-09-03 DIAGNOSIS — E039 Hypothyroidism, unspecified: Secondary | ICD-10-CM | POA: Diagnosis not present

## 2020-09-03 DIAGNOSIS — Z7901 Long term (current) use of anticoagulants: Secondary | ICD-10-CM | POA: Diagnosis not present

## 2020-09-03 DIAGNOSIS — E785 Hyperlipidemia, unspecified: Secondary | ICD-10-CM | POA: Diagnosis not present

## 2020-09-03 DIAGNOSIS — M1711 Unilateral primary osteoarthritis, right knee: Secondary | ICD-10-CM | POA: Diagnosis not present

## 2020-09-03 DIAGNOSIS — Z79890 Hormone replacement therapy (postmenopausal): Secondary | ICD-10-CM | POA: Diagnosis not present

## 2020-09-03 LAB — CBC
HCT: 35 % — ABNORMAL LOW (ref 36.0–46.0)
Hemoglobin: 12 g/dL (ref 12.0–15.0)
MCH: 29.9 pg (ref 26.0–34.0)
MCHC: 34.3 g/dL (ref 30.0–36.0)
MCV: 87.3 fL (ref 80.0–100.0)
Platelets: 167 10*3/uL (ref 150–400)
RBC: 4.01 MIL/uL (ref 3.87–5.11)
RDW: 13.7 % (ref 11.5–15.5)
WBC: 11.1 10*3/uL — ABNORMAL HIGH (ref 4.0–10.5)
nRBC: 0 % (ref 0.0–0.2)

## 2020-09-03 LAB — BASIC METABOLIC PANEL
Anion gap: 9 (ref 5–15)
BUN: 21 mg/dL (ref 8–23)
CO2: 28 mmol/L (ref 22–32)
Calcium: 9.7 mg/dL (ref 8.9–10.3)
Chloride: 103 mmol/L (ref 98–111)
Creatinine, Ser: 0.99 mg/dL (ref 0.44–1.00)
GFR, Estimated: 59 mL/min — ABNORMAL LOW (ref >60)
Glucose, Bld: 130 mg/dL — ABNORMAL HIGH (ref 70–99)
Potassium: 3.6 mmol/L (ref 3.5–5.1)
Sodium: 140 mmol/L (ref 135–145)

## 2020-09-03 NOTE — TOC Transition Note (Signed)
Transition of Care [facility]) - CM/SW Discharge Note  Patient Details  Name: Melissa Faulkner MRN: [id number] Date of Birth: 1945/08/26  Transition of Care [facility]) CM/SW Contact:  Sherie Don, LCSW Phone Number: 09/03/2020, 10:00 AM  Clinical Narrative: Patient is expected to discharge home after working with PT. CSW met with patient to confirm discharge plan. Patient will discharge home with OPPT through [facility]. Patient has a rolling walker, wheelchair, shower chair, crutches, and high toilets so there are no DME needs at this time. TOC signing off.  Final next level of care: OP Rehab Barriers to Discharge: No Barriers Identified  Patient Goals and CMS Choice Patient states their goals for this hospitalization and ongoing recovery are:: Discharge home with [place] CMS Medicare.gov Compare Post Acute Care list provided to:: Patient Choice offered to / list presented to : Patient  Discharge Plan and Services         DME Arranged: N/A DME Agency: NA  Readmission Risk Interventions No flowsheet data found.

## 2020-09-03 NOTE — Discharge Summary (Signed)
Discharge Summary  Patient ID: Melissa Faulkner MRN: [id number] DOB/AGE: 77-Oct-1947 77 y.o.  Admit date: 09/02/2020 Discharge date: 09/03/2020  Admission Diagnoses:  Osteoarthritis of right knee  Discharge Diagnoses:  Principal Problem:   Osteoarthritis of right knee Active Problems:   S/P TKR (total knee replacement), right   Past Medical History:  Diagnosis Date   Arthritis    Asthma    Atrial fibrillation ([place])    Heart murmur    noted in 71 -no echo ever done   Hyperlipidemia    Hypertension    Hypothyroidism    Skin cancer    R arm   Wears glasses     Surgeries: Procedure(s): TOTAL KNEE ARTHROPLASTY on 09/02/2020   Consultants (if any):   Discharged Condition: Improved  Hospital Course: Melissa Faulkner is an 75 y.o. female who was admitted 09/02/2020 with a diagnosis of Osteoarthritis of right knee and went to the operating room on 09/02/2020 and underwent the above named procedures.    She was given perioperative antibiotics:  Anti-infectives (From admission, onward)    Start     Dose/Rate Route Frequency Ordered Stop   09/02/20 1400  ceFAZolin (ANCEF) IVPB 2g/100 mL premix        2 g 200 mL/hr over 30 Minutes Intravenous Every 6 hours 09/02/20 1027 09/02/20 2100   09/02/20 0600  ceFAZolin (ANCEF) IVPB 2g/100 mL premix        2 g 200 mL/hr over 30 Minutes Intravenous On call to O.R. 09/02/20 0517 09/02/20 0800     .  She was given sequential compression devices, early ambulation, basline aspirin and Xarelto for DVT prophylaxis.  She benefited maximally from the hospital stay and there were no complications.    Recent vital signs:  Vitals:   09/03/20 0627 09/03/20 1000  BP: 96/69 136/88  Pulse: 76 68  Resp: 15 16  Temp: 98 F (36.7 C) 98.4 F (36.9 C)  SpO2: 97% 97%    Recent laboratory studies:  Lab Results  Component Value Date   HGB 12.0 09/03/2020   HGB 14.4 08/22/2020   HGB 14.4 04/29/2020   Lab Results  Component Value Date   WBC 11.1  (H) 09/03/2020   PLT 167 09/03/2020   No results found for: INR Lab Results  Component Value Date   NA 140 09/03/2020   K 3.6 09/03/2020   CL 103 09/03/2020   CO2 28 09/03/2020   BUN 21 09/03/2020   CREATININE 0.99 09/03/2020   GLUCOSE 130 (H) 09/03/2020    Discharge Medications:   Allergies as of 09/03/2020       Reactions   Zestril [lisinopril]    Lips swelled   Zocor [simvastatin]    Leg cramps        Medication List     STOP taking these medications    HYDROcodone-acetaminophen 10-325 MG tablet Commonly known as: Norco       TAKE these medications    amLODipine 5 MG tablet Commonly known as: NORVASC Take 5 mg by mouth daily.   aspirin EC 81 MG tablet Take 81 mg by mouth daily. Swallow whole.   atorvastatin 40 MG tablet Commonly known as: LIPITOR TAKE 1 TABLET BY MOUTH ONCE DAILY.   B-12 2500 MCG Tabs Take 2,500 mcg by mouth daily.   CALCIUM 1200 PO Take 1,200 mg by mouth daily.   chlorthalidone 25 MG tablet Commonly known as: HYGROTON Take 12.5 mg by mouth daily.   CRANBERRY PO Take  500 mg by mouth daily.   levothyroxine 50 MCG tablet Commonly known as: SYNTHROID Take 50 mcg by mouth daily before breakfast.   metoprolol succinate 25 MG 24 hr tablet Commonly known as: TOPROL-XL Take 1 tablet (25 mg total) by mouth daily.   multivitamin with minerals tablet Take 1 tablet by mouth daily.   OMEGA 3-6-9 COMPLEX PO Take 1 capsule by mouth daily.   ondansetron 4 MG tablet Commonly known as: Zofran Take 1 tablet (4 mg total) by mouth every 8 (eight) hours as needed for nausea or vomiting.   rivaroxaban 20 MG Tabs tablet Commonly known as: Xarelto TAKE (1) TABLET DAILY WITH SUPPER. What changed:  how much to take how to take this when to take this   theophylline 600 MG 24 hr tablet Commonly known as: UNIPHYL Take 300 mg by mouth 2 (two) times a day.   Ventolin HFA 108 (90 Base) MCG/ACT inhaler Generic drug: albuterol Inhale 2  puffs into the lungs every 4 (four) hours as needed for wheezing or shortness of breath.        Diagnostic Studies: DG Knee Right Port  Result Date: 09/02/2020 CLINICAL DATA:  Status post right knee arthroplasty EXAM: PORTABLE RIGHT KNEE - 1-2 VIEW COMPARISON:  07/14/2020 FINDINGS: New right knee prosthesis is noted in satisfactory position. No acute soft tissue or bony abnormality is noted. IMPRESSION: Status post right knee replacement. Electronically Signed   By: Inez Catalina M.D.   On: 09/02/2020 12:32    Disposition: Discharge disposition: 01-Home or Self Care         Follow-up Information     Marchia Bond, MD. Go on 09/15/2020.   Specialty: Orthopedic Surgery Why: Your appointment has been scheduled for 11:30. Contact information: [place] [address] [phone]         [facility], [place]. Go on 09/04/2020.   Why: You are scheduled to start outpatient physical therapy at 2:00. Please arrive at 1:45 to complete your paperwork Contact information: [facility] [place] [address] [phone]                  Signed: Jola Baptist 09/03/2020, 12:53 PM

## 2020-09-03 NOTE — Progress Notes (Addendum)
Subjective: 1 Day Post-Op s/p Procedure(s): TOTAL KNEE ARTHROPLASTY   Patient is alert, oriented. Patient reports pain as mild at rest and with movement.   Denies chest pain, SOB, Calf pain. No nausea or vomiting since yesterday afternoon after PT. Has been able to stomach crackers and ginger ale. No significant appetite this morning.   Objective:  PE: VITALS:   Vitals:   09/02/20 1920 09/03/20 0018 09/03/20 0301 09/03/20 0627  BP: 112/78 110/69 119/75 96/69  Pulse: 83 70 71 76  Resp:  '16 14 15  '$ Temp: 97.6 F (36.4 C) 98.1 F (36.7 C) 97.9 F (36.6 C) 98 F (36.7 C)  TempSrc:    Oral  SpO2: 97% 97% 97% 97%  Weight:      Height:        ABD soft Sensation intact distally Intact pulses distally Dorsiflexion/Plantar flexion intact Incision: dressing C/D/I Compartment soft  LABS  Results for orders placed or performed during the hospital encounter of 09/02/20 (from the past 24 hour(s))  CBC     Status: Abnormal   Collection Time: 09/03/20  3:48 AM  Result Value Ref Range   WBC 11.1 (H) 4.0 - 10.5 K/uL   RBC 4.01 3.87 - 5.11 MIL/uL   Hemoglobin 12.0 12.0 - 15.0 g/dL   HCT 35.0 (L) 36.0 - 46.0 %   MCV 87.3 80.0 - 100.0 fL   MCH 29.9 26.0 - 34.0 pg   MCHC 34.3 30.0 - 36.0 g/dL   RDW 13.7 11.5 - 15.5 %   Platelets 167 150 - 400 K/uL   nRBC 0.0 0.0 - 0.2 %  Basic metabolic panel     Status: Abnormal   Collection Time: 09/03/20  3:48 AM  Result Value Ref Range   Sodium 140 135 - 145 mmol/L   Potassium 3.6 3.5 - 5.1 mmol/L   Chloride 103 98 - 111 mmol/L   CO2 28 22 - 32 mmol/L   Glucose, Bld 130 (H) 70 - 99 mg/dL   BUN 21 8 - 23 mg/dL   Creatinine, Ser 0.99 0.44 - 1.00 mg/dL   Calcium 9.7 8.9 - 10.3 mg/dL   GFR, Estimated 59 (L) >60 mL/min   Anion gap 9 5 - 15    DG Knee Right Port  Result Date: 09/02/2020 CLINICAL DATA:  Status post right knee arthroplasty EXAM: PORTABLE RIGHT KNEE - 1-2 VIEW COMPARISON:  07/14/2020 FINDINGS: New right knee prosthesis  is noted in satisfactory position. No acute soft tissue or bony abnormality is noted. IMPRESSION: Status post right knee replacement. Electronically Signed   By: Inez Catalina M.D.   On: 09/02/2020 12:32    Assessment/Plan: Principal Problem:   Osteoarthritis of right knee Active Problems:   S/P TKR (total knee replacement), right  1 Day Post-Op s/p Procedure(s): TOTAL KNEE ARTHROPLASTY -overall doing well - slight leukocytosis, likely due to surgery - patient with 3 episodes of post operative nausea and vomiting, tolerating crackers and ginger ale this morning. Will see how she does with PT this morning. She has zofran at home to use as needed  Weightbearing: WBAT RLE Insicional and dressing care: Reinforce dressings as needed, new dressing placed this morning Orthopedic device(s): Please place operative leg ted hose prior to discharge VTE prophylaxis: restarting home Aspirin 81 mg today and home Xarelto 20 mg this evening. Pain control: under good control. Tylenol as needed. Patient has norco 5/325 at home which she will use as needed Follow - up plan: 2  weeks with Dr. Mardelle Matte Dispo: pending stair training with PT.   Contact information:   Weekdays 8-5 [address], [place] [phone] A fter hours and holidays please check [email or web address] for group call information for [facility]  Ventura Bruns 09/03/2020, 8:12 AM

## 2020-09-03 NOTE — Plan of Care (Signed)
Plan of care reviewed and discussed with the patient. 

## 2020-09-03 NOTE — Progress Notes (Signed)
Physical Therapy Treatment Patient Details Name: Melissa Faulkner MRN: [id number] DOB: 24-Apr-1945 Today's Date: 09/03/2020    History of Present Illness Patient is 75 y.o. female s/p Rt TKA on 09/02/20 with PMH significant for OA, asthma, Afib, HLD, HTN, hypothyroidism, great toe repair, Lt TSA.    PT Comments    POD # 1 am session Assisted OOB to amb an increased distance and practiced one step she has.  Then returned to room to perform some TE's following HEP handout.  Instructed on proper tech, freq as well as use of ICE.   Addressed all mobility questions, discussed appropriate activity, educated on use of ICE.  Pt ready for D/C to home.   Follow Up Recommendations  Follow surgeon's recommendation for DC plan and follow-up therapies;Outpatient PT     Equipment Recommendations       Recommendations for Other Services       Precautions / Restrictions Precautions Precautions: Fall Precaution Comments: instructed no pillow under knee Restrictions Weight Bearing Restrictions: No Other Position/Activity Restrictions: WBAT    Mobility  Bed Mobility Overal bed mobility: Needs Assistance Bed Mobility: Supine to Sit     Supine to sit: Supervision     General bed mobility comments: pt self able to rise LE    Transfers Overall transfer level: Needs assistance Equipment used: Rolling walker (2 wheeled) Transfers: Sit to/from [facility] Sit to Stand: Supervision Stand pivot transfers: Supervision;Min guard       General transfer comment: one VC on safety with turns  Ambulation/Gait Ambulation/Gait assistance: Supervision Gait Distance (Feet): 85 Feet Assistive device: Rolling walker (2 wheeled) Gait Pattern/deviations: Step-to pattern;Decreased stride length;Decreased weight shift to right;Antalgic Gait velocity: decreased   General Gait Details: one VC safety with proper walker to self distance with turns   Stairs Stairs: Yes Stairs assistance:  Supervision;Min guard Stair Management: With walker;Step to pattern;Forwards;No rails Number of Stairs: 1 General stair comments: practiced one step/curb forward with walker with one VC's on proper tech and safety.  Performed well.   Wheelchair Mobility    Modified Rankin (Stroke Patients Only)       Balance                                            Cognition Arousal/Alertness: Awake/alert Behavior During Therapy: WFL for tasks assessed/performed Overall Cognitive Status: Within Functional Limits for tasks assessed                                 General Comments: AxO x 3 very motivated "tomoorw is my Birthday".      Exercises  Total Knee Replacement TE's following HEP handout 10 reps B LE ankle pumps 05 reps towel squeezes 05 reps knee presses 05 reps heel slides  05 reps SAQ's 05 reps SLR's 05 reps ABD Educated on use of gait belt to assist with TE's Followed by ICE     General Comments        Pertinent Vitals/Pain Pain Assessment: 0-10 Pain Score: 5  Pain Location: R knee Pain Descriptors / Indicators: Discomfort;Operative site guarding;Aching;Tightness Pain Intervention(s): Monitored during session;Premedicated before session;Repositioned;Ice applied    Home Living                      Prior Function  PT Goals (current goals can now be found in the care plan section) Progress towards PT goals: Progressing toward goals    Frequency    7X/week      PT Plan Current plan remains appropriate    Co-evaluation              AM-PAC PT "6 Clicks" Mobility   Outcome Measure  Help needed turning from your back to your side while in a flat bed without using bedrails?: A Little Help needed moving from lying on your back to sitting on the side of a flat bed without using bedrails?: A Little Help needed moving to and from a bed to a chair (including a wheelchair)?: A Little Help needed standing  up from a chair using your arms (e.g., wheelchair or bedside chair)?: A Little Help needed to walk in hospital room?: A Little Help needed climbing 3-5 steps with a railing? : A Little 6 Click Score: 18    End of Session Equipment Utilized During Treatment: Gait belt Activity Tolerance: Patient tolerated treatment well Patient left: in chair;with call bell/phone within reach Nurse Communication: Mobility status (pt ready for D/C to home) PT Visit Diagnosis: Muscle weakness (generalized) (M62.81);Difficulty in walking, not elsewhere classified (R26.2)     Time: [id number] PT Time Calculation (min) (ACUTE ONLY): 26 min  Charges:  $Gait Training: 8-22 mins $Therapeutic Exercise: 8-22 mins                     Rica Koyanagi  PTA Acute  Rehabilitation Services Pager      [phone] Office      [phone]

## 2020-09-04 DIAGNOSIS — R262 Difficulty in walking, not elsewhere classified: Secondary | ICD-10-CM | POA: Diagnosis not present

## 2020-09-04 DIAGNOSIS — M1711 Unilateral primary osteoarthritis, right knee: Secondary | ICD-10-CM | POA: Diagnosis not present

## 2020-09-04 DIAGNOSIS — M25661 Stiffness of right knee, not elsewhere classified: Secondary | ICD-10-CM | POA: Diagnosis not present

## 2020-09-04 DIAGNOSIS — M6281 Muscle weakness (generalized): Secondary | ICD-10-CM | POA: Diagnosis not present

## 2020-09-05 ENCOUNTER — Encounter (HOSPITAL_COMMUNITY): Payer: Self-pay | Admitting: Orthopedic Surgery

## 2020-09-11 DIAGNOSIS — M6281 Muscle weakness (generalized): Secondary | ICD-10-CM | POA: Diagnosis not present

## 2020-09-11 DIAGNOSIS — R262 Difficulty in walking, not elsewhere classified: Secondary | ICD-10-CM | POA: Diagnosis not present

## 2020-09-11 DIAGNOSIS — M25661 Stiffness of right knee, not elsewhere classified: Secondary | ICD-10-CM | POA: Diagnosis not present

## 2020-09-11 DIAGNOSIS — M1711 Unilateral primary osteoarthritis, right knee: Secondary | ICD-10-CM | POA: Diagnosis not present

## 2020-09-15 DIAGNOSIS — M1711 Unilateral primary osteoarthritis, right knee: Secondary | ICD-10-CM | POA: Diagnosis not present

## 2020-09-16 DIAGNOSIS — M6281 Muscle weakness (generalized): Secondary | ICD-10-CM | POA: Diagnosis not present

## 2020-09-16 DIAGNOSIS — M25661 Stiffness of right knee, not elsewhere classified: Secondary | ICD-10-CM | POA: Diagnosis not present

## 2020-09-16 DIAGNOSIS — R262 Difficulty in walking, not elsewhere classified: Secondary | ICD-10-CM | POA: Diagnosis not present

## 2020-09-16 DIAGNOSIS — M1711 Unilateral primary osteoarthritis, right knee: Secondary | ICD-10-CM | POA: Diagnosis not present

## 2020-09-18 DIAGNOSIS — M1711 Unilateral primary osteoarthritis, right knee: Secondary | ICD-10-CM | POA: Diagnosis not present

## 2020-09-18 DIAGNOSIS — R262 Difficulty in walking, not elsewhere classified: Secondary | ICD-10-CM | POA: Diagnosis not present

## 2020-09-18 DIAGNOSIS — M6281 Muscle weakness (generalized): Secondary | ICD-10-CM | POA: Diagnosis not present

## 2020-09-18 DIAGNOSIS — M25661 Stiffness of right knee, not elsewhere classified: Secondary | ICD-10-CM | POA: Diagnosis not present

## 2020-09-23 DIAGNOSIS — M6281 Muscle weakness (generalized): Secondary | ICD-10-CM | POA: Diagnosis not present

## 2020-09-23 DIAGNOSIS — M1711 Unilateral primary osteoarthritis, right knee: Secondary | ICD-10-CM | POA: Diagnosis not present

## 2020-09-23 DIAGNOSIS — M25661 Stiffness of right knee, not elsewhere classified: Secondary | ICD-10-CM | POA: Diagnosis not present

## 2020-09-23 DIAGNOSIS — R262 Difficulty in walking, not elsewhere classified: Secondary | ICD-10-CM | POA: Diagnosis not present

## 2020-09-24 DIAGNOSIS — M25661 Stiffness of right knee, not elsewhere classified: Secondary | ICD-10-CM | POA: Diagnosis not present

## 2020-09-24 DIAGNOSIS — R262 Difficulty in walking, not elsewhere classified: Secondary | ICD-10-CM | POA: Diagnosis not present

## 2020-09-24 DIAGNOSIS — M1711 Unilateral primary osteoarthritis, right knee: Secondary | ICD-10-CM | POA: Diagnosis not present

## 2020-09-24 DIAGNOSIS — M6281 Muscle weakness (generalized): Secondary | ICD-10-CM | POA: Diagnosis not present

## 2020-09-29 DIAGNOSIS — M6281 Muscle weakness (generalized): Secondary | ICD-10-CM | POA: Diagnosis not present

## 2020-09-29 DIAGNOSIS — M1711 Unilateral primary osteoarthritis, right knee: Secondary | ICD-10-CM | POA: Diagnosis not present

## 2020-09-29 DIAGNOSIS — R262 Difficulty in walking, not elsewhere classified: Secondary | ICD-10-CM | POA: Diagnosis not present

## 2020-09-29 DIAGNOSIS — M25661 Stiffness of right knee, not elsewhere classified: Secondary | ICD-10-CM | POA: Diagnosis not present

## 2020-10-13 DIAGNOSIS — M1711 Unilateral primary osteoarthritis, right knee: Secondary | ICD-10-CM | POA: Diagnosis not present

## 2020-10-15 DIAGNOSIS — Z20828 Contact with and (suspected) exposure to other viral communicable diseases: Secondary | ICD-10-CM | POA: Diagnosis not present

## 2020-10-15 DIAGNOSIS — Z23 Encounter for immunization: Secondary | ICD-10-CM | POA: Diagnosis not present

## 2020-11-05 ENCOUNTER — Other Ambulatory Visit: Payer: Self-pay | Admitting: Nurse Practitioner

## 2020-11-05 DIAGNOSIS — Z1231 Encounter for screening mammogram for malignant neoplasm of breast: Secondary | ICD-10-CM

## 2020-11-18 DIAGNOSIS — Z20828 Contact with and (suspected) exposure to other viral communicable diseases: Secondary | ICD-10-CM | POA: Diagnosis not present

## 2020-11-18 DIAGNOSIS — Z23 Encounter for immunization: Secondary | ICD-10-CM | POA: Diagnosis not present

## 2020-11-19 ENCOUNTER — Encounter: Payer: Self-pay | Admitting: *Deleted

## 2020-11-19 ENCOUNTER — Encounter: Payer: Self-pay | Admitting: Cardiology

## 2020-11-19 ENCOUNTER — Ambulatory Visit (INDEPENDENT_AMBULATORY_CARE_PROVIDER_SITE_OTHER): Payer: Medicare Other | Admitting: Cardiology

## 2020-11-19 VITALS — BP 130/76 | HR 74 | Ht 65.0 in | Wt 158.2 lb

## 2020-11-19 DIAGNOSIS — I4891 Unspecified atrial fibrillation: Secondary | ICD-10-CM

## 2020-11-19 DIAGNOSIS — E782 Mixed hyperlipidemia: Secondary | ICD-10-CM

## 2020-11-19 DIAGNOSIS — I10 Essential (primary) hypertension: Secondary | ICD-10-CM

## 2020-11-19 MED ORDER — METOPROLOL SUCCINATE ER 25 MG PO TB24: 12.5000 mg | ORAL_TABLET | Freq: Two times a day (BID) | ORAL | Status: DC

## 2020-11-19 MED ORDER — METOPROLOL SUCCINATE ER 25 MG PO TB24: 12.5000 mg | ORAL_TABLET | Freq: Every day | ORAL | Status: DC

## 2020-11-19 MED ORDER — RIVAROXABAN 20 MG PO TABS: 20.0000 mg | ORAL_TABLET | Freq: Every day | ORAL | 3 refills | Status: DC

## 2020-11-19 NOTE — Progress Notes (Signed)
Clinical Summary Melissa Faulkner is a 75 y.o.female seen today for follow up of the following medical problems.    1. Permanent afib - occasioanl palpitations, overall mild - no bleeding on xarelto. CrCl today is 54     2. HTN - compliant with meds -- no recent low bp's, had been a previous issue       3. Hyperlipidemia - upcoming labs with pcp, she is on atorvastatin    4. Recent knee replacement 08/2020     SH: good friends with Melissa Faulkner and his wife, also a patient of mine.      Past Medical History:  Diagnosis Date   Arthritis    Asthma    Atrial fibrillation ([facility])    Heart murmur    noted in 73 -no echo ever done   Hyperlipidemia    Hypertension    Hypothyroidism    Skin cancer    R arm   Wears glasses      Allergies  Allergen Reactions   Zestril [Lisinopril]     Lips swelled   Zocor [Simvastatin]     Leg cramps     Current Outpatient Medications  Medication Sig Dispense Refill   amLODipine (NORVASC) 5 MG tablet Take 5 mg by mouth daily.     aspirin EC 81 MG tablet Take 81 mg by mouth daily. Swallow whole.     atorvastatin (LIPITOR) 40 MG tablet TAKE 1 TABLET BY MOUTH ONCE DAILY. (Patient taking differently: Take 40 mg by mouth daily.) 30 tablet 6   Calcium Carbonate-Vit D-Min (CALCIUM 1200 PO) Take 1,200 mg by mouth daily.     chlorthalidone (HYGROTON) 25 MG tablet Take 12.5 mg by mouth daily.     CRANBERRY PO Take 500 mg by mouth daily.     Cyanocobalamin (B-12) 2500 MCG TABS Take 2,500 mcg by mouth daily.     levothyroxine (SYNTHROID) 50 MCG tablet Take 50 mcg by mouth daily before breakfast.     metoprolol succinate (TOPROL-XL) 25 MG 24 hr tablet Take 1 tablet (25 mg total) by mouth daily. 90 tablet 3   Multiple Vitamins-Minerals (MULTIVITAMIN WITH MINERALS) tablet Take 1 tablet by mouth daily.     Omega 3-6-9 Fatty Acids (OMEGA 3-6-9 COMPLEX PO) Take 1 capsule by mouth daily.     ondansetron (ZOFRAN) 4 MG tablet Take 1 tablet (4 mg total)  by mouth every 8 (eight) hours as needed for nausea or vomiting. (Patient not taking: No sig reported) 10 tablet 0   rivaroxaban (XARELTO) 20 MG TABS tablet TAKE (1) TABLET DAILY WITH SUPPER. (Patient taking differently: Take 20 mg by mouth daily with supper. TAKE (1) TABLET DAILY WITH SUPPER.) 30 tablet 6   theophylline (UNIPHYL) 600 MG 24 hr tablet Take 300 mg by mouth 2 (two) times a day.      VENTOLIN HFA 108 (90 Base) MCG/ACT inhaler Inhale 2 puffs into the lungs every 4 (four) hours as needed for wheezing or shortness of breath.     No current facility-administered medications for this visit.     Past Surgical History:  Procedure Laterality Date   ABDOMINAL HYSTERECTOMY  2002   CARPAL TUNNEL RELEASE  2005   both rt/lt   COLONOSCOPY     GREAT TOE ARTHRODESIS, INTERPHALANGEAL JOINT  2014   right   GREAT TOE ARTHRODESIS, Melissa Faulkner PROCEDURE  2015   left   MENISCUS REPAIR  03/2019   PLANTAR FASCIA SURGERY  2015   left  SKIN CANCER EXCISION     R arm   TOTAL KNEE ARTHROPLASTY Right 09/02/2020   Procedure: TOTAL KNEE ARTHROPLASTY;  Surgeon: Marchia Bond, MD;  Location: [facility];  Service: Orthopedics;  Laterality: Right;   TOTAL SHOULDER ARTHROPLASTY  2012   [facility]   TOTAL SHOULDER ARTHROPLASTY Left 05/06/2020   Procedure: TOTAL SHOULDER ARTHROPLASTY;  Surgeon: Marchia Bond, MD;  Location: [facility];  Service: Orthopedics;  Laterality: Left;   TUBAL LIGATION       Allergies  Allergen Reactions   Zestril [Lisinopril]     Lips swelled   Zocor [Simvastatin]     Leg cramps      Family History  Problem Relation Age of Onset   Clotting disorder Mother        blood clot post surgery   Stomach cancer Father      Social History Melissa Faulkner reports that she has never smoked. She has never used smokeless tobacco. Melissa Faulkner reports that she does not currently use alcohol.   Review of Systems CONSTITUTIONAL: No weight loss, fever, chills, weakness or fatigue.  HEENT:  Eyes: No visual loss, blurred vision, double vision or yellow sclerae.No hearing loss, sneezing, congestion, runny nose or sore throat.  SKIN: No rash or itching.  CARDIOVASCULAR: per hpi RESPIRATORY: No shortness of breath, cough or sputum.  GASTROINTESTINAL: No anorexia, nausea, vomiting or diarrhea. No abdominal pain or blood.  GENITOURINARY: No burning on urination, no polyuria NEUROLOGICAL: No headache, dizziness, syncope, paralysis, ataxia, numbness or tingling in the extremities. No change in bowel or bladder control.  MUSCULOSKELETAL: No muscle, back pain, joint pain or stiffness.  LYMPHATICS: No enlarged nodes. No history of splenectomy.  PSYCHIATRIC: No history of depression or anxiety.  ENDOCRINOLOGIC: No reports of sweating, cold or heat intolerance. No polyuria or polydipsia.  Marland Kitchen   Physical Examination Today's Vitals   11/19/20 1336  BP: 130/76  Pulse: 74  SpO2: 98%  Weight: 158 lb 3.2 oz (71.8 kg)  Height: 5\' 5"  (1.651 m)   Body mass index is 26.33 kg/m.  Gen: resting comfortably, no acute distress HEENT: no scleral icterus, pupils equal round and reactive, no palptable cervical adenopathy,  CV: irreg, 2/6 systolic murmur apex Resp: Clear to auscultation bilaterally GI: abdomen is soft, non-tender, non-distended, normal bowel sounds, no hepatosplenomegaly MSK: extremities are warm, no edema.  Skin: warm, no rash Neuro:  no focal deficits Psych: appropriate affect      Assessment and Plan  1. Permanent afib - mild palpitations at times, continue current meds - generalized fatigue of unclear cause, will try taking toprol 12.5mg  bid though unclear beta blocker is related. Could try dilt as alternative but would have to come off norvasc, she is hesitant for many chagnes as bp's just got controlled   2. HTN -at goal,c toninue current meds   3. Hyperlipidemia - continue atorvastatin, request labs from pcp        Arnoldo Lenis, M.D.,

## 2020-11-19 NOTE — Patient Instructions (Addendum)
Medication Instructions:  Change Toprol to 12.5mg  twice a day   Stop Aspirin. Xarelto refilled today  Continue all other medications.     Labwork: none  Testing/Procedures: none  Follow-Up: Your physician wants you to follow up in: 6 months.  You will receive a reminder letter in the mail one-two months in advance.  If you don't receive a letter, please call our office to schedule the follow up appointment   Any Other Special Instructions Will Be Listed Below (If Applicable).   If you need a refill on your cardiac medications before your next appointment, please call your pharmacy.

## 2020-11-20 ENCOUNTER — Ambulatory Visit: Payer: Medicare Other | Admitting: Cardiology

## 2020-12-01 DIAGNOSIS — M1711 Unilateral primary osteoarthritis, right knee: Secondary | ICD-10-CM | POA: Diagnosis not present

## 2020-12-19 ENCOUNTER — Ambulatory Visit: Payer: Medicare Other

## 2020-12-23 DIAGNOSIS — Z0000 Encounter for general adult medical examination without abnormal findings: Secondary | ICD-10-CM | POA: Diagnosis not present

## 2020-12-23 DIAGNOSIS — Z79899 Other long term (current) drug therapy: Secondary | ICD-10-CM | POA: Diagnosis not present

## 2020-12-23 DIAGNOSIS — R5383 Other fatigue: Secondary | ICD-10-CM | POA: Diagnosis not present

## 2020-12-23 DIAGNOSIS — Z6827 Body mass index (BMI) 27.0-27.9, adult: Secondary | ICD-10-CM | POA: Diagnosis not present

## 2020-12-23 DIAGNOSIS — Z7189 Other specified counseling: Secondary | ICD-10-CM | POA: Diagnosis not present

## 2020-12-23 DIAGNOSIS — Z789 Other specified health status: Secondary | ICD-10-CM | POA: Diagnosis not present

## 2020-12-23 DIAGNOSIS — E039 Hypothyroidism, unspecified: Secondary | ICD-10-CM | POA: Diagnosis not present

## 2020-12-23 DIAGNOSIS — E785 Hyperlipidemia, unspecified: Secondary | ICD-10-CM | POA: Diagnosis not present

## 2020-12-23 DIAGNOSIS — Z1339 Encounter for screening examination for other mental health and behavioral disorders: Secondary | ICD-10-CM | POA: Diagnosis not present

## 2020-12-23 DIAGNOSIS — Z1331 Encounter for screening for depression: Secondary | ICD-10-CM | POA: Diagnosis not present

## 2020-12-23 DIAGNOSIS — Z299 Encounter for prophylactic measures, unspecified: Secondary | ICD-10-CM | POA: Diagnosis not present

## 2020-12-23 DIAGNOSIS — I10 Essential (primary) hypertension: Secondary | ICD-10-CM | POA: Diagnosis not present

## 2021-01-09 IMAGING — MG DIGITAL SCREENING BILAT W/ TOMO W/ CAD
8 series · 8 of 24 positions shown · non-contrast
Comparison: Previous exam(s).

CLINICAL DATA: Screening.

EXAM:
DIGITAL SCREENING BILATERAL MAMMOGRAM WITH TOMO AND CAD

[R MLO synth-2D]
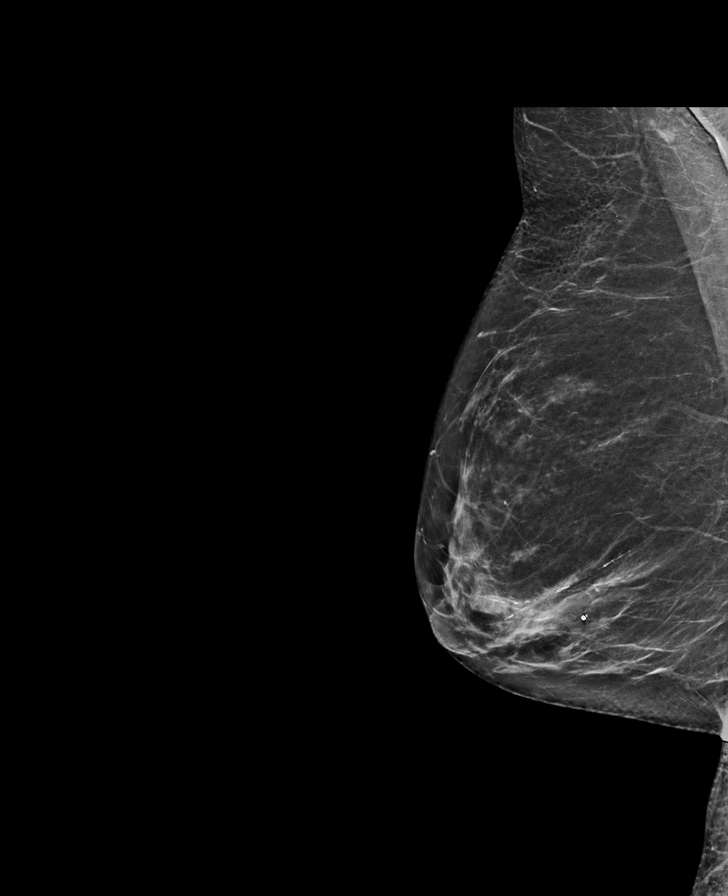

[L MLO synth-2D]
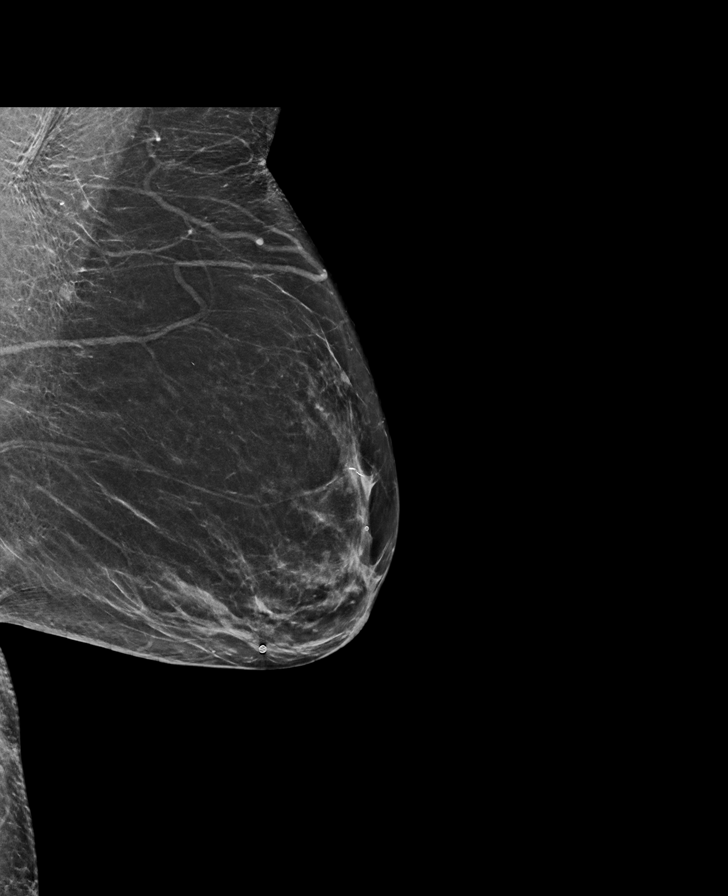

[R CC synth-2D]
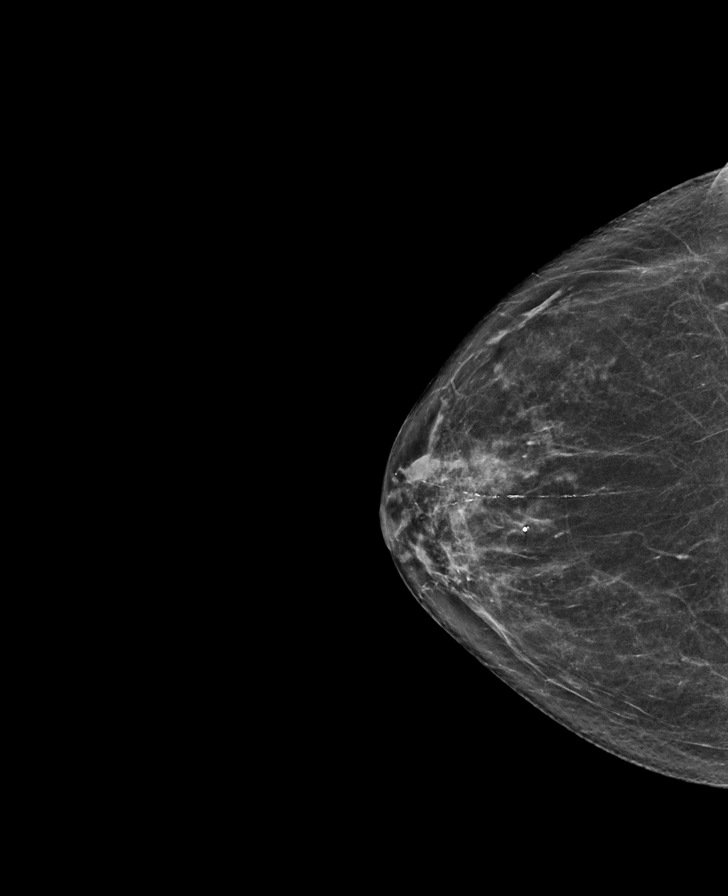

[L CC synth-2D]
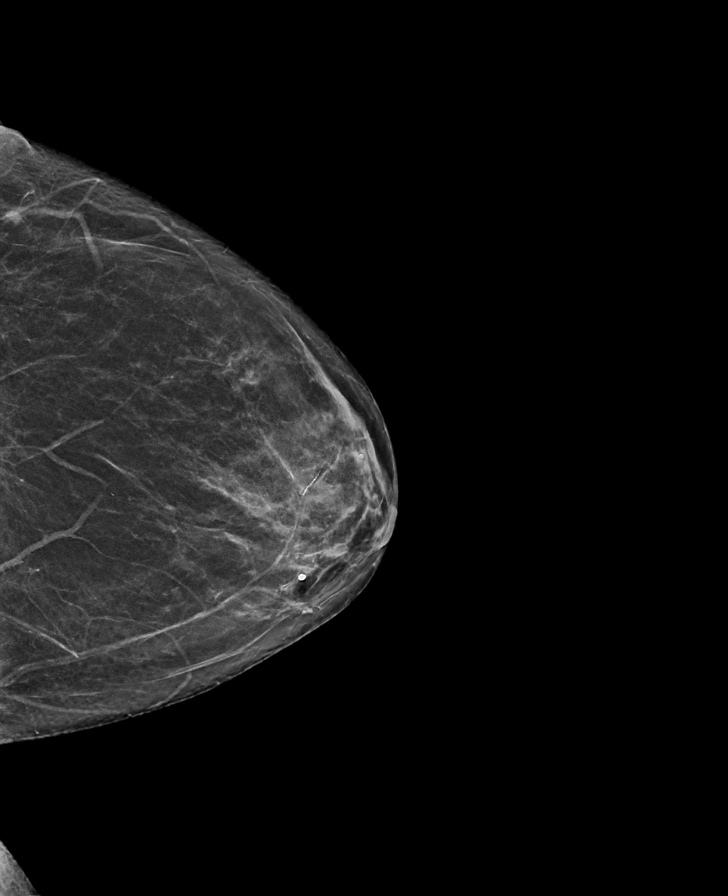

[L MLO tomo · tomo slice 35/68.0]
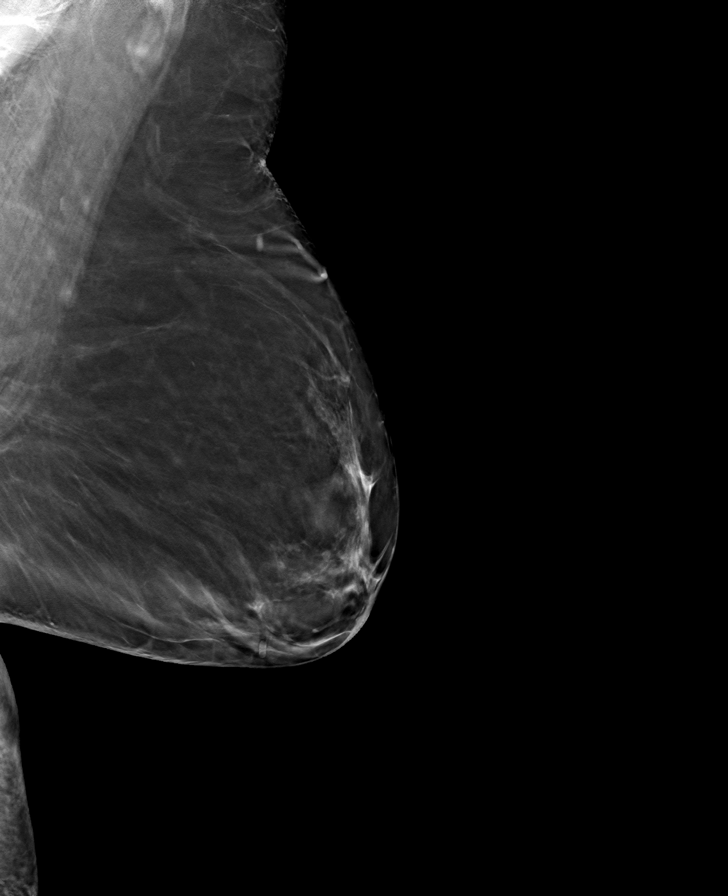

[L CC tomo · tomo slice 29/57.0]
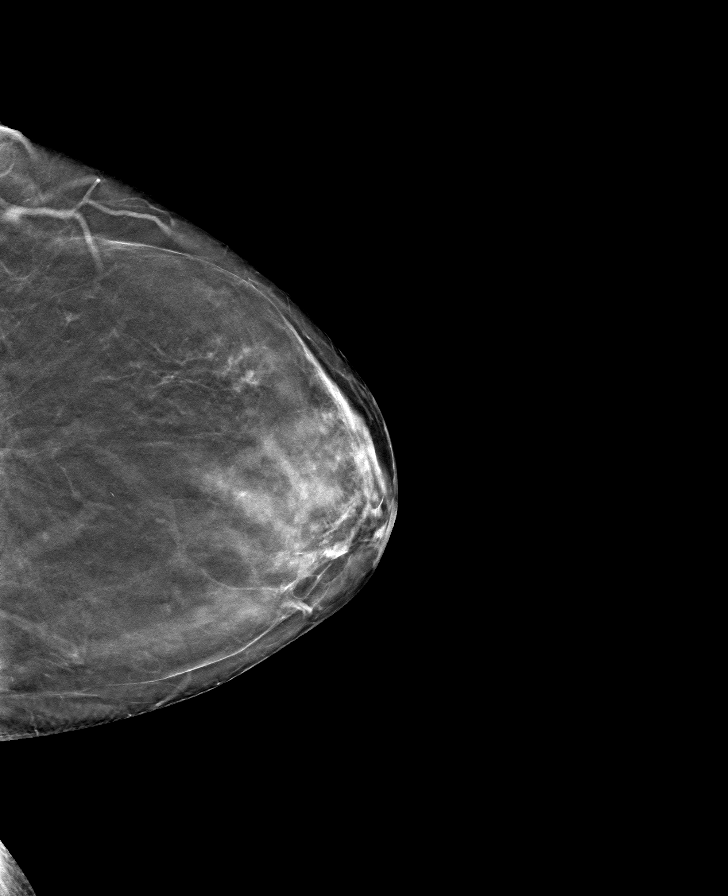

[R MLO tomo · tomo slice 34/67.0]
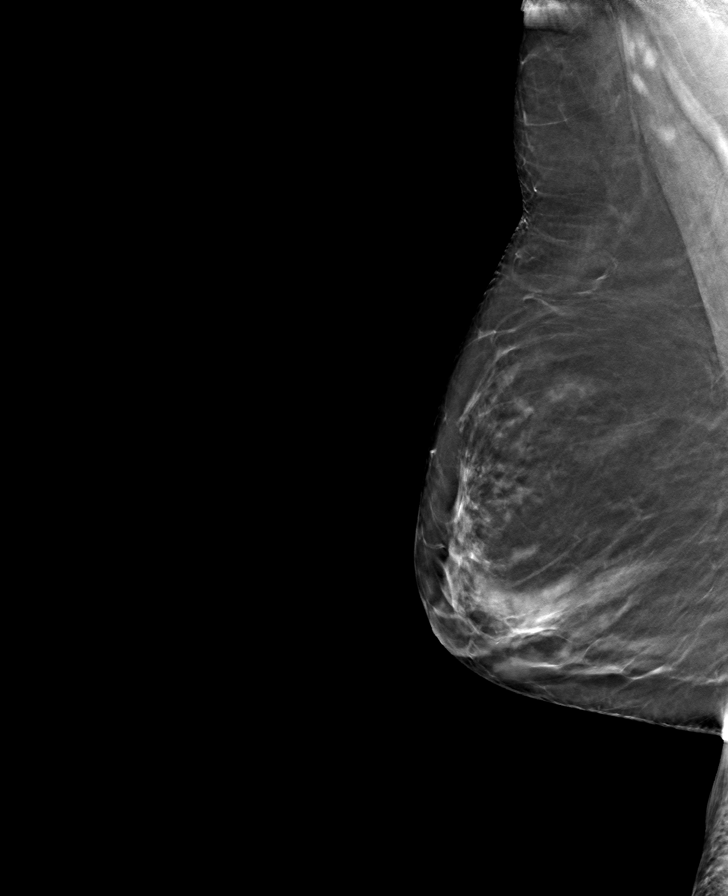

[R CC tomo · tomo slice 31/62.0]
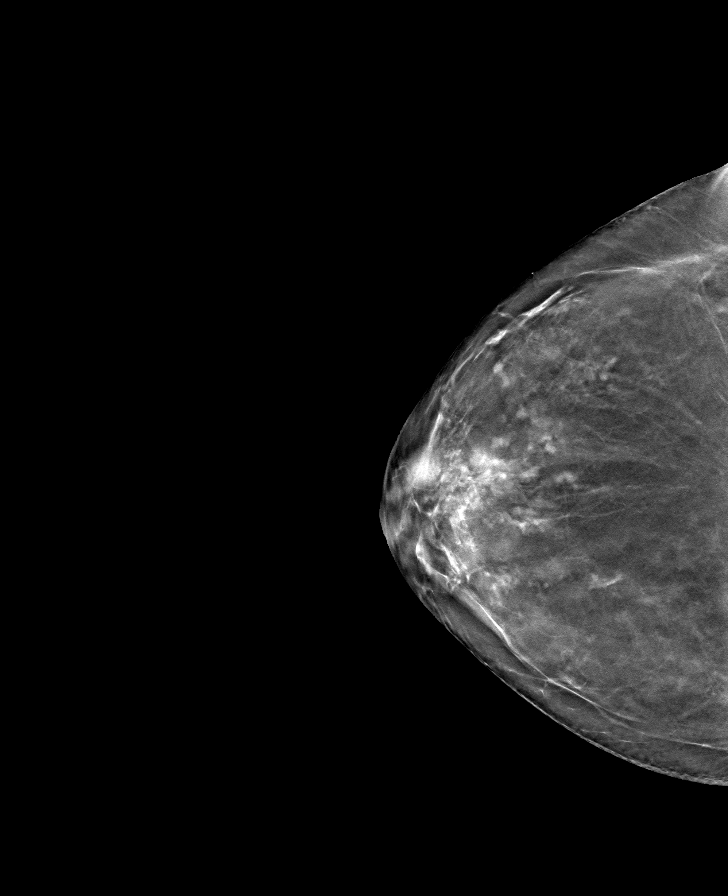

[8 of 24 positions shown; findings below may reference images not displayed]

ACR Breast Density Category b: There are scattered areas of
fibroglandular density.
FINDINGS: There are no findings suspicious for malignancy. Images were
processed with CAD.
IMPRESSION: No mammographic evidence of malignancy. A result letter of this
screening mammogram will be mailed directly to the patient.

RECOMMENDATION:
Screening mammogram in one year. (Code:[id number])

BI-RADS CATEGORY  1: Negative.

## 2021-01-16 ENCOUNTER — Ambulatory Visit
Admission: RE | Admit: 2021-01-16 | Discharge: 2021-01-16 | Disposition: A | Discharge status: Home / Self Care | Payer: Medicare Other | Source: Ambulatory Visit | Attending: Nurse Practitioner | Admitting: Nurse Practitioner

## 2021-01-16 DIAGNOSIS — Z1231 Encounter for screening mammogram for malignant neoplasm of breast: Secondary | ICD-10-CM

## 2021-02-02 ENCOUNTER — Encounter: Payer: Self-pay | Admitting: Obstetrics & Gynecology

## 2021-02-02 ENCOUNTER — Other Ambulatory Visit: Payer: Self-pay

## 2021-02-02 ENCOUNTER — Ambulatory Visit (INDEPENDENT_AMBULATORY_CARE_PROVIDER_SITE_OTHER): Payer: Medicare Other | Admitting: Obstetrics & Gynecology

## 2021-02-02 VITALS — BP 127/80 | HR 66 | Ht 65.0 in | Wt 160.0 lb

## 2021-02-02 DIAGNOSIS — R1032 Left lower quadrant pain: Secondary | ICD-10-CM | POA: Diagnosis not present

## 2021-02-02 DIAGNOSIS — G8929 Other chronic pain: Secondary | ICD-10-CM | POA: Diagnosis not present

## 2021-02-02 NOTE — Progress Notes (Signed)
Chief Complaint  Patient presents with   Pelvic Pain    Pain in groin for several years      76 y.o. No obstetric history on file. No LMP recorded. Patient has had a hysterectomy. The current method of family planning is status post hysterectomy.  Outpatient Encounter Medications as of 02/02/2021  Medication Sig   amLODipine (NORVASC) 5 MG tablet Take 5 mg by mouth daily.   atorvastatin (LIPITOR) 40 MG tablet TAKE 1 TABLET BY MOUTH ONCE DAILY. (Patient taking differently: Take 40 mg by mouth daily.)   Calcium Carbonate-Vit D-Min (CALCIUM 1200 PO) Take 1,200 mg by mouth daily.   chlorthalidone (HYGROTON) 25 MG tablet Take 12.5 mg by mouth daily.   CRANBERRY PO Take 500 mg by mouth daily.   Cyanocobalamin (B-12) 5000 MCG CAPS Take 1 capsule by mouth daily.   levothyroxine (SYNTHROID) 50 MCG tablet Take 50 mcg by mouth daily before breakfast.   metoprolol succinate (TOPROL-XL) 25 MG 24 hr tablet Take 0.5 tablets (12.5 mg total) by mouth 2 (two) times daily.   Multiple Vitamins-Minerals (MULTIVITAMIN WITH MINERALS) tablet Take 1 tablet by mouth daily.   rivaroxaban (XARELTO) 20 MG TABS tablet Take 1 tablet (20 mg total) by mouth daily with supper.   theophylline (THEODUR) 300 MG 12 hr tablet Take 300 mg by mouth 2 (two) times daily.   VENTOLIN HFA 108 (90 Base) MCG/ACT inhaler Inhale 2 puffs into the lungs every 4 (four) hours as needed for wheezing or shortness of breath.   [DISCONTINUED] Omega 3-6-9 Fatty Acids (OMEGA 3-6-9 COMPLEX PO) Take 1 capsule by mouth daily.   No facility-administered encounter medications on file as of 02/02/2021.    Subjective Pt has lef groin pain for at least 10 years She notices it when she stands for prolonged periods of time Notices it when she is preparing supper, sits down a few minutes to eat and then it is gone away She has had 2 shoulders, 1 knee and left big toe joint replaced due to arthritis She only had 1 ovary removed, not sure which  one when she had her hysterectomy 07/29/2000 Past Medical History:  Diagnosis Date   Arthritis    Asthma    Atrial fibrillation ([place])    Heart murmur    noted in 71 -no echo ever done   Hyperlipidemia    Hypertension    Hypothyroidism    Skin cancer    R arm   Wears glasses     Past Surgical History:  Procedure Laterality Date   ABDOMINAL HYSTERECTOMY  2002   CARPAL TUNNEL RELEASE  2005   both rt/lt   COLONOSCOPY     GREAT TOE ARTHRODESIS, INTERPHALANGEAL JOINT  2014   right   GREAT TOE ARTHRODESIS, [place] PROCEDURE  2015   left   MENISCUS REPAIR  03/2019   PLANTAR FASCIA SURGERY  2015   left   SKIN CANCER EXCISION     R arm   TOTAL KNEE ARTHROPLASTY Right 09/02/2020   Procedure: TOTAL KNEE ARTHROPLASTY;  Surgeon: Marchia Bond, MD;  Location: [facility];  Service: Orthopedics;  Laterality: Right;   TOTAL SHOULDER ARTHROPLASTY  2012   [facility]   TOTAL SHOULDER ARTHROPLASTY Left 05/06/2020   Procedure: TOTAL SHOULDER ARTHROPLASTY;  Surgeon: Marchia Bond, MD;  Location: [facility];  Service: Orthopedics;  Laterality: Left;   TUBAL LIGATION      OB History   No obstetric history on file.  Allergies  Allergen Reactions   Zestril [Lisinopril]     Lips swelled   Zocor [Simvastatin]     Leg cramps    Social History   Socioeconomic History   Marital status: Married    Spouse name: Not on file   Number of children: Not on file   Years of education: Not on file   Highest education level: Not on file  Occupational History   Not on file  Tobacco Use   Smoking status: Never   Smokeless tobacco: Never  Vaping Use   Vaping Use: Never used  Substance and Sexual Activity   Alcohol use: Not Currently   Drug use: No   Sexual activity: Not Currently    Birth control/protection: None  Other Topics Concern   Not on file  Social History Narrative   Not on file   Social Determinants of Health   Financial Resource Strain: Low Risk    Difficulty of Paying Living  Expenses: Not hard at all  Food Insecurity: No Food Insecurity   Worried About Charity fundraiser in the Last Year: Never true   [place] in the Last Year: Never true  Transportation Needs: No Transportation Needs   Lack of Transportation (Medical): No   Lack of Transportation (Non-Medical): No  Physical Activity: Inactive   Days of Exercise per Week: 0 days   Minutes of Exercise per Session: 0 min  Stress: No Stress Concern Present   Feeling of Stress : Not at all  Social Connections: Socially Integrated   Frequency of Communication with Friends and Family: More than three times a week   Frequency of Social Gatherings with Friends and Family: Twice a week   Attends Religious Services: More than 4 times per year   Active Member of [employer] or Organizations: Yes   Attends Music therapist: More than 4 times per year   Marital Status: Married    Family History  Problem Relation Age of Onset   Stomach cancer Father    Clotting disorder Mother        blood clot post surgery    Medications:       Current Outpatient Medications:    amLODipine (NORVASC) 5 MG tablet, Take 5 mg by mouth daily., Disp: , Rfl:    atorvastatin (LIPITOR) 40 MG tablet, TAKE 1 TABLET BY MOUTH ONCE DAILY. (Patient taking differently: Take 40 mg by mouth daily.), Disp: 30 tablet, Rfl: 6   Calcium Carbonate-Vit D-Min (CALCIUM 1200 PO), Take 1,200 mg by mouth daily., Disp: , Rfl:    chlorthalidone (HYGROTON) 25 MG tablet, Take 12.5 mg by mouth daily., Disp: , Rfl:    CRANBERRY PO, Take 500 mg by mouth daily., Disp: , Rfl:    Cyanocobalamin (B-12) 5000 MCG CAPS, Take 1 capsule by mouth daily., Disp: , Rfl:    levothyroxine (SYNTHROID) 50 MCG tablet, Take 50 mcg by mouth daily before breakfast., Disp: , Rfl:    metoprolol succinate (TOPROL-XL) 25 MG 24 hr tablet, Take 0.5 tablets (12.5 mg total) by mouth 2 (two) times daily., Disp: , Rfl:    Multiple Vitamins-Minerals (MULTIVITAMIN WITH MINERALS)  tablet, Take 1 tablet by mouth daily., Disp: , Rfl:    rivaroxaban (XARELTO) 20 MG TABS tablet, Take 1 tablet (20 mg total) by mouth daily with supper., Disp: 90 tablet, Rfl: 3   theophylline (THEODUR) 300 MG 12 hr tablet, Take 300 mg by mouth 2 (two) times daily., Disp: , Rfl:  VENTOLIN HFA 108 (90 Base) MCG/ACT inhaler, Inhale 2 puffs into the lungs every 4 (four) hours as needed for wheezing or shortness of breath., Disp: , Rfl:   Objective Blood pressure 127/80, pulse 66, height 5\' 5"  (1.651 m), weight 160 lb (72.6 kg).  General WDWN female NAD Vulva:  normal appearing vulva with no masses, tenderness or lesions Vagina:  normal mucosa, no discharge Cervix:  absent Uterus:  absent Adnexa: ovaries:present,  normal adnexa in size, nontender and no masses  Provocative testing on hip was negative for labral pathology  Pertinent ROS No burning with urination, frequency or urgency No nausea, vomiting or diarrhea Nor fever chills or other constitutional symptoms   Labs or studies reviewed    Impression Diagnoses this Encounter::   ICD-10-CM   1. Groin pain, chronic, left  R10.32 US PELVIS (TRANSABDOMINAL ONLY)   G89.29       Established relevant diagnosis(es):   Plan/Recommendations: No orders of the defined types were placed in this encounter.   Labs or Scans Ordered: Orders Placed This Encounter  Procedures   US PELVIS (TRANSABDOMINAL ONLY)    Management:: Pelvic sonogram, clinically most consistent with left hip arthritis    Follow up Return in about 2 weeks (around 02/16/2021) for GYN sono.     All questions were answered.

## 2021-02-18 DIAGNOSIS — D239 Other benign neoplasm of skin, unspecified: Secondary | ICD-10-CM | POA: Diagnosis not present

## 2021-02-18 DIAGNOSIS — Z1283 Encounter for screening for malignant neoplasm of skin: Secondary | ICD-10-CM | POA: Diagnosis not present

## 2021-02-18 DIAGNOSIS — L570 Actinic keratosis: Secondary | ICD-10-CM | POA: Diagnosis not present

## 2021-02-27 ENCOUNTER — Other Ambulatory Visit: Payer: Self-pay

## 2021-02-27 ENCOUNTER — Ambulatory Visit (INDEPENDENT_AMBULATORY_CARE_PROVIDER_SITE_OTHER): Payer: Medicare Other

## 2021-02-27 DIAGNOSIS — R1032 Left lower quadrant pain: Secondary | ICD-10-CM | POA: Diagnosis not present

## 2021-02-27 DIAGNOSIS — G8929 Other chronic pain: Secondary | ICD-10-CM

## 2021-02-27 NOTE — Progress Notes (Signed)
[place] TA/TV: normal vaginal cuff,right oophorectomy,normal left ovary (limited view),no free fluid,left adnexal discomfort during ultrasound,but not the same LLQ pain.  Chaperone Peggy

## 2021-03-10 ENCOUNTER — Telehealth: Payer: Self-pay | Admitting: Obstetrics & Gynecology

## 2021-03-10 NOTE — Telephone Encounter (Signed)
Patient is calling wanting the results of the ultra sound she back on 02/27/21. She states that no one has called and she is asking for a call back ?

## 2021-03-25 DIAGNOSIS — Z789 Other specified health status: Secondary | ICD-10-CM | POA: Diagnosis not present

## 2021-03-25 DIAGNOSIS — I4891 Unspecified atrial fibrillation: Secondary | ICD-10-CM | POA: Diagnosis not present

## 2021-03-25 DIAGNOSIS — I10 Essential (primary) hypertension: Secondary | ICD-10-CM | POA: Diagnosis not present

## 2021-03-25 DIAGNOSIS — E785 Hyperlipidemia, unspecified: Secondary | ICD-10-CM | POA: Diagnosis not present

## 2021-03-25 DIAGNOSIS — Z299 Encounter for prophylactic measures, unspecified: Secondary | ICD-10-CM | POA: Diagnosis not present

## 2021-03-25 DIAGNOSIS — J45909 Unspecified asthma, uncomplicated: Secondary | ICD-10-CM | POA: Diagnosis not present

## 2021-06-12 DIAGNOSIS — H524 Presbyopia: Secondary | ICD-10-CM | POA: Diagnosis not present

## 2021-06-12 DIAGNOSIS — H43813 Vitreous degeneration, bilateral: Secondary | ICD-10-CM | POA: Diagnosis not present

## 2021-06-12 DIAGNOSIS — H3562 Retinal hemorrhage, left eye: Secondary | ICD-10-CM | POA: Diagnosis not present

## 2021-06-30 DIAGNOSIS — J45909 Unspecified asthma, uncomplicated: Secondary | ICD-10-CM | POA: Diagnosis not present

## 2021-06-30 DIAGNOSIS — Z299 Encounter for prophylactic measures, unspecified: Secondary | ICD-10-CM | POA: Diagnosis not present

## 2021-06-30 DIAGNOSIS — I10 Essential (primary) hypertension: Secondary | ICD-10-CM | POA: Diagnosis not present

## 2021-06-30 DIAGNOSIS — R5383 Other fatigue: Secondary | ICD-10-CM | POA: Diagnosis not present

## 2021-06-30 DIAGNOSIS — E039 Hypothyroidism, unspecified: Secondary | ICD-10-CM | POA: Diagnosis not present

## 2021-07-13 DIAGNOSIS — Z23 Encounter for immunization: Secondary | ICD-10-CM | POA: Diagnosis not present

## 2021-07-15 DIAGNOSIS — D485 Neoplasm of uncertain behavior of skin: Secondary | ICD-10-CM | POA: Diagnosis not present

## 2021-07-15 DIAGNOSIS — L820 Inflamed seborrheic keratosis: Secondary | ICD-10-CM | POA: Diagnosis not present

## 2021-09-14 DIAGNOSIS — Z96651 Presence of right artificial knee joint: Secondary | ICD-10-CM | POA: Diagnosis not present

## 2021-09-14 DIAGNOSIS — M545 Low back pain, unspecified: Secondary | ICD-10-CM | POA: Diagnosis not present

## 2021-09-14 DIAGNOSIS — M25512 Pain in left shoulder: Secondary | ICD-10-CM | POA: Diagnosis not present

## 2021-09-14 DIAGNOSIS — M25552 Pain in left hip: Secondary | ICD-10-CM | POA: Diagnosis not present

## 2021-10-01 DIAGNOSIS — D6869 Other thrombophilia: Secondary | ICD-10-CM | POA: Diagnosis not present

## 2021-10-01 DIAGNOSIS — Z789 Other specified health status: Secondary | ICD-10-CM | POA: Diagnosis not present

## 2021-10-01 DIAGNOSIS — I4892 Unspecified atrial flutter: Secondary | ICD-10-CM | POA: Diagnosis not present

## 2021-10-01 DIAGNOSIS — I10 Essential (primary) hypertension: Secondary | ICD-10-CM | POA: Diagnosis not present

## 2021-10-01 DIAGNOSIS — Z299 Encounter for prophylactic measures, unspecified: Secondary | ICD-10-CM | POA: Diagnosis not present

## 2021-10-01 DIAGNOSIS — Z23 Encounter for immunization: Secondary | ICD-10-CM | POA: Diagnosis not present

## 2021-10-01 DIAGNOSIS — I4891 Unspecified atrial fibrillation: Secondary | ICD-10-CM | POA: Diagnosis not present

## 2021-10-22 DIAGNOSIS — Z23 Encounter for immunization: Secondary | ICD-10-CM | POA: Diagnosis not present

## 2021-10-26 ENCOUNTER — Other Ambulatory Visit: Payer: Self-pay | Admitting: Internal Medicine

## 2021-10-26 DIAGNOSIS — Z139 Encounter for screening, unspecified: Secondary | ICD-10-CM

## 2021-11-30 DIAGNOSIS — I739 Peripheral vascular disease, unspecified: Secondary | ICD-10-CM | POA: Diagnosis not present

## 2021-11-30 DIAGNOSIS — M79672 Pain in left foot: Secondary | ICD-10-CM | POA: Diagnosis not present

## 2021-11-30 DIAGNOSIS — M79675 Pain in left toe(s): Secondary | ICD-10-CM | POA: Diagnosis not present

## 2021-11-30 DIAGNOSIS — L110 Acquired keratosis follicularis: Secondary | ICD-10-CM | POA: Diagnosis not present

## 2021-11-30 DIAGNOSIS — M79671 Pain in right foot: Secondary | ICD-10-CM | POA: Diagnosis not present

## 2021-11-30 DIAGNOSIS — M79674 Pain in right toe(s): Secondary | ICD-10-CM | POA: Diagnosis not present

## 2021-12-16 DIAGNOSIS — Z0000 Encounter for general adult medical examination without abnormal findings: Secondary | ICD-10-CM | POA: Diagnosis not present

## 2021-12-16 DIAGNOSIS — I10 Essential (primary) hypertension: Secondary | ICD-10-CM | POA: Diagnosis not present

## 2021-12-16 DIAGNOSIS — Z789 Other specified health status: Secondary | ICD-10-CM | POA: Diagnosis not present

## 2021-12-16 DIAGNOSIS — Z1331 Encounter for screening for depression: Secondary | ICD-10-CM | POA: Diagnosis not present

## 2021-12-16 DIAGNOSIS — R5383 Other fatigue: Secondary | ICD-10-CM | POA: Diagnosis not present

## 2021-12-16 DIAGNOSIS — E7800 Pure hypercholesterolemia, unspecified: Secondary | ICD-10-CM | POA: Diagnosis not present

## 2021-12-16 DIAGNOSIS — Z79899 Other long term (current) drug therapy: Secondary | ICD-10-CM | POA: Diagnosis not present

## 2021-12-16 DIAGNOSIS — Z6827 Body mass index (BMI) 27.0-27.9, adult: Secondary | ICD-10-CM | POA: Diagnosis not present

## 2021-12-16 DIAGNOSIS — Z299 Encounter for prophylactic measures, unspecified: Secondary | ICD-10-CM | POA: Diagnosis not present

## 2021-12-16 DIAGNOSIS — Z7189 Other specified counseling: Secondary | ICD-10-CM | POA: Diagnosis not present

## 2021-12-16 DIAGNOSIS — E039 Hypothyroidism, unspecified: Secondary | ICD-10-CM | POA: Diagnosis not present

## 2021-12-16 DIAGNOSIS — B029 Zoster without complications: Secondary | ICD-10-CM | POA: Diagnosis not present

## 2021-12-16 DIAGNOSIS — Z1339 Encounter for screening examination for other mental health and behavioral disorders: Secondary | ICD-10-CM | POA: Diagnosis not present

## 2021-12-17 ENCOUNTER — Encounter (INDEPENDENT_AMBULATORY_CARE_PROVIDER_SITE_OTHER): Payer: Self-pay | Admitting: *Deleted

## 2022-01-05 DIAGNOSIS — E876 Hypokalemia: Secondary | ICD-10-CM | POA: Diagnosis not present

## 2022-01-11 ENCOUNTER — Ambulatory Visit: Payer: Medicare Other | Attending: Cardiology | Admitting: Cardiology

## 2022-01-11 ENCOUNTER — Encounter: Payer: Self-pay | Admitting: Cardiology

## 2022-01-11 VITALS — BP 124/78 | HR 72 | Ht 65.0 in | Wt 161.0 lb

## 2022-01-11 DIAGNOSIS — E782 Mixed hyperlipidemia: Secondary | ICD-10-CM

## 2022-01-11 DIAGNOSIS — I4891 Unspecified atrial fibrillation: Secondary | ICD-10-CM

## 2022-01-11 DIAGNOSIS — I10 Essential (primary) hypertension: Secondary | ICD-10-CM

## 2022-01-11 MED ORDER — RIVAROXABAN 20 MG PO TABS: 20.0000 mg | ORAL_TABLET | Freq: Every day | ORAL | 3 refills | Status: DC

## 2022-01-11 NOTE — Patient Instructions (Addendum)
Medication Instructions:  Xarelto refilled today  Continue all current medications.  Labwork: none  Testing/Procedures: none  Follow-Up: Your physician wants you to follow up in:  1 year.  You should receive a call from the office when due.  If you don't receive this call, please call our office to schedule the follow up appointment    Any Other Special Instructions Will Be Listed Below (If Applicable).   If you need a refill on your cardiac medications before your next appointment, please call your pharmacy.

## 2022-01-11 NOTE — Progress Notes (Signed)
Clinical Summary Ms. Nehring is a 77 y.o.female seen today for follow up of the following medical problems.      1. Permanent afib - occasioanl palpitations, overall mild - no bleeding on xarelto. CrCl today is 54   - fatigue last visit, unclear if beta blocker related but tried changing toprol to 12.'5mg'$  bid.  - 2 episodes of palpitations over the last several months, last episode few months ago lasted about 20 minutes.     2. HTN - compliant with meds -- no recent low bp's, had been a previous issue   - compliant with meds     3. Hyperlipidemia - upcoming labs with pcp, she is on atorvastatin 12/2021 LDL 115    4. Recent knee replacement 08/2020   5. Shingles - recent episode, nearly fully recovered   SH: good friends with [name] and his wife, also a patient of mine.  Past Medical History:  Diagnosis Date   Arthritis    Asthma    Atrial fibrillation ([facility])    Heart murmur    noted in 71 -no echo ever done   Hyperlipidemia    Hypertension    Hypothyroidism    Skin cancer    R arm   Wears glasses      Allergies  Allergen Reactions   Zestril [Lisinopril]     Lips swelled   Zocor [Simvastatin]     Leg cramps     Current Outpatient Medications  Medication Sig Dispense Refill   amLODipine (NORVASC) 5 MG tablet Take 5 mg by mouth daily.     atorvastatin (LIPITOR) 40 MG tablet TAKE 1 TABLET BY MOUTH ONCE DAILY. (Patient taking differently: Take 40 mg by mouth daily.) 30 tablet 6   Calcium Carbonate-Vit D-Min (CALCIUM 1200 PO) Take 1,200 mg by mouth daily.     chlorthalidone (HYGROTON) 25 MG tablet Take 12.5 mg by mouth daily.     CRANBERRY PO Take 500 mg by mouth daily.     Cyanocobalamin (B-12) 5000 MCG CAPS Take 1 capsule by mouth daily.     levothyroxine (SYNTHROID) 50 MCG tablet Take 50 mcg by mouth daily before breakfast.     metoprolol succinate (TOPROL-XL) 25 MG 24 hr tablet Take 0.5 tablets (12.5 mg total) by mouth 2 (two) times daily.      Multiple Vitamins-Minerals (MULTIVITAMIN WITH MINERALS) tablet Take 1 tablet by mouth daily.     rivaroxaban (XARELTO) 20 MG TABS tablet Take 1 tablet (20 mg total) by mouth daily with supper. 90 tablet 3   theophylline (THEODUR) 300 MG 12 hr tablet Take 300 mg by mouth 2 (two) times daily.     VENTOLIN HFA 108 (90 Base) MCG/ACT inhaler Inhale 2 puffs into the lungs every 4 (four) hours as needed for wheezing or shortness of breath.     No current facility-administered medications for this visit.     Past Surgical History:  Procedure Laterality Date   ABDOMINAL HYSTERECTOMY  2002   CARPAL TUNNEL RELEASE  2005   both rt/lt   COLONOSCOPY     GREAT TOE ARTHRODESIS, INTERPHALANGEAL JOINT  2014   right   GREAT TOE ARTHRODESIS, [place] PROCEDURE  2015   left   MENISCUS REPAIR  03/2019   PLANTAR FASCIA SURGERY  2015   left   SKIN CANCER EXCISION     R arm   TOTAL KNEE ARTHROPLASTY Right 09/02/2020   Procedure: TOTAL KNEE ARTHROPLASTY;  Surgeon: Marchia Bond, MD;  Location: [facility];  Service: Orthopedics;  Laterality: Right;   TOTAL SHOULDER ARTHROPLASTY  2012   [facility]   TOTAL SHOULDER ARTHROPLASTY Left 05/06/2020   Procedure: TOTAL SHOULDER ARTHROPLASTY;  Surgeon: Marchia Bond, MD;  Location: [facility];  Service: Orthopedics;  Laterality: Left;   TUBAL LIGATION       Allergies  Allergen Reactions   Zestril [Lisinopril]     Lips swelled   Zocor [Simvastatin]     Leg cramps      Family History  Problem Relation Age of Onset   Stomach cancer Father    Clotting disorder Mother        blood clot post surgery     Social History Ms. Little reports that she has never smoked. She has never used smokeless tobacco. Ms. Canova reports that she does not currently use alcohol.   Review of Systems CONSTITUTIONAL: No weight loss, fever, chills, weakness or fatigue.  HEENT: Eyes: No visual loss, blurred vision, double vision or yellow sclerae.No hearing loss, sneezing,  congestion, runny nose or sore throat.  SKIN: No rash or itching.  CARDIOVASCULAR: per hpi RESPIRATORY: No shortness of breath, cough or sputum.  GASTROINTESTINAL: No anorexia, nausea, vomiting or diarrhea. No abdominal pain or blood.  GENITOURINARY: No burning on urination, no polyuria NEUROLOGICAL: No headache, dizziness, syncope, paralysis, ataxia, numbness or tingling in the extremities. No change in bowel or bladder control.  MUSCULOSKELETAL: No muscle, back pain, joint pain or stiffness.  LYMPHATICS: No enlarged nodes. No history of splenectomy.  PSYCHIATRIC: No history of depression or anxiety.  ENDOCRINOLOGIC: No reports of sweating, cold or heat intolerance. No polyuria or polydipsia.  Marland Kitchen   Physical Examination Today's Vitals   01/11/22 0914  BP: 124/78  Pulse: 72  SpO2: 96%  Weight: 161 lb (73 kg)  Height: '5\' 5"'$  (1.651 m)   Body mass index is 26.79 kg/m.  Gen: resting comfortably, no acute distress HEENT: no scleral icterus, pupils equal round and reactive, no palptable cervical adenopathy,  CV: irreg, no m/rg no jvd Resp: Clear to auscultation bilaterally GI: abdomen is soft, non-tender, non-distended, normal bowel sounds, no hepatosplenomegaly MSK: extremities are warm, no edema.  Skin: warm, no rash Neuro:  no focal deficits Psych: appropriate affect   Diagnostic Studies     Assessment and Plan   1. Permanent afib/acquired thrombophilia - rare infrequent episodes, we discussed adding a possible prn dose of CCB or BB but she favors just monitoring at this time which is reasonable - continue xarelto for stroke prevention - EKG today shows rate controlled afib   2. HTN -bp is at goal, continue current meds   3. Hyperlipidemia - at goal, continue current meds  F/u 1 year  Arnoldo Lenis, M.D.

## 2022-01-19 ENCOUNTER — Ambulatory Visit: Payer: Medicare Other

## 2022-02-17 DIAGNOSIS — D239 Other benign neoplasm of skin, unspecified: Secondary | ICD-10-CM | POA: Diagnosis not present

## 2022-02-17 DIAGNOSIS — L111 Transient acantholytic dermatosis [Grover]: Secondary | ICD-10-CM | POA: Diagnosis not present

## 2022-02-17 DIAGNOSIS — L570 Actinic keratosis: Secondary | ICD-10-CM | POA: Diagnosis not present

## 2022-02-24 ENCOUNTER — Ambulatory Visit
Admission: RE | Admit: 2022-02-24 | Discharge: 2022-02-24 | Disposition: A | Discharge status: Home / Self Care | Payer: Medicare Other | Source: Ambulatory Visit | Attending: Internal Medicine | Admitting: Internal Medicine

## 2022-02-24 DIAGNOSIS — Z1231 Encounter for screening mammogram for malignant neoplasm of breast: Secondary | ICD-10-CM | POA: Diagnosis not present

## 2022-02-24 DIAGNOSIS — Z139 Encounter for screening, unspecified: Secondary | ICD-10-CM

## 2022-03-04 ENCOUNTER — Encounter: Payer: Self-pay | Admitting: Radiology

## 2022-03-23 DIAGNOSIS — I10 Essential (primary) hypertension: Secondary | ICD-10-CM | POA: Diagnosis not present

## 2022-03-23 DIAGNOSIS — I4891 Unspecified atrial fibrillation: Secondary | ICD-10-CM | POA: Diagnosis not present

## 2022-03-23 DIAGNOSIS — Z299 Encounter for prophylactic measures, unspecified: Secondary | ICD-10-CM | POA: Diagnosis not present

## 2022-03-23 DIAGNOSIS — E039 Hypothyroidism, unspecified: Secondary | ICD-10-CM | POA: Diagnosis not present

## 2022-03-23 DIAGNOSIS — D6869 Other thrombophilia: Secondary | ICD-10-CM | POA: Diagnosis not present

## 2022-06-03 IMAGING — DX DG SHOULDER 1V*L*
1 series · 1 of 1 positions shown · non-contrast
Comparison: CT 04/07/2020.

CLINICAL DATA: Left shoulder replacement.

EXAM:
LEFT SHOULDER

[shoulder ap]
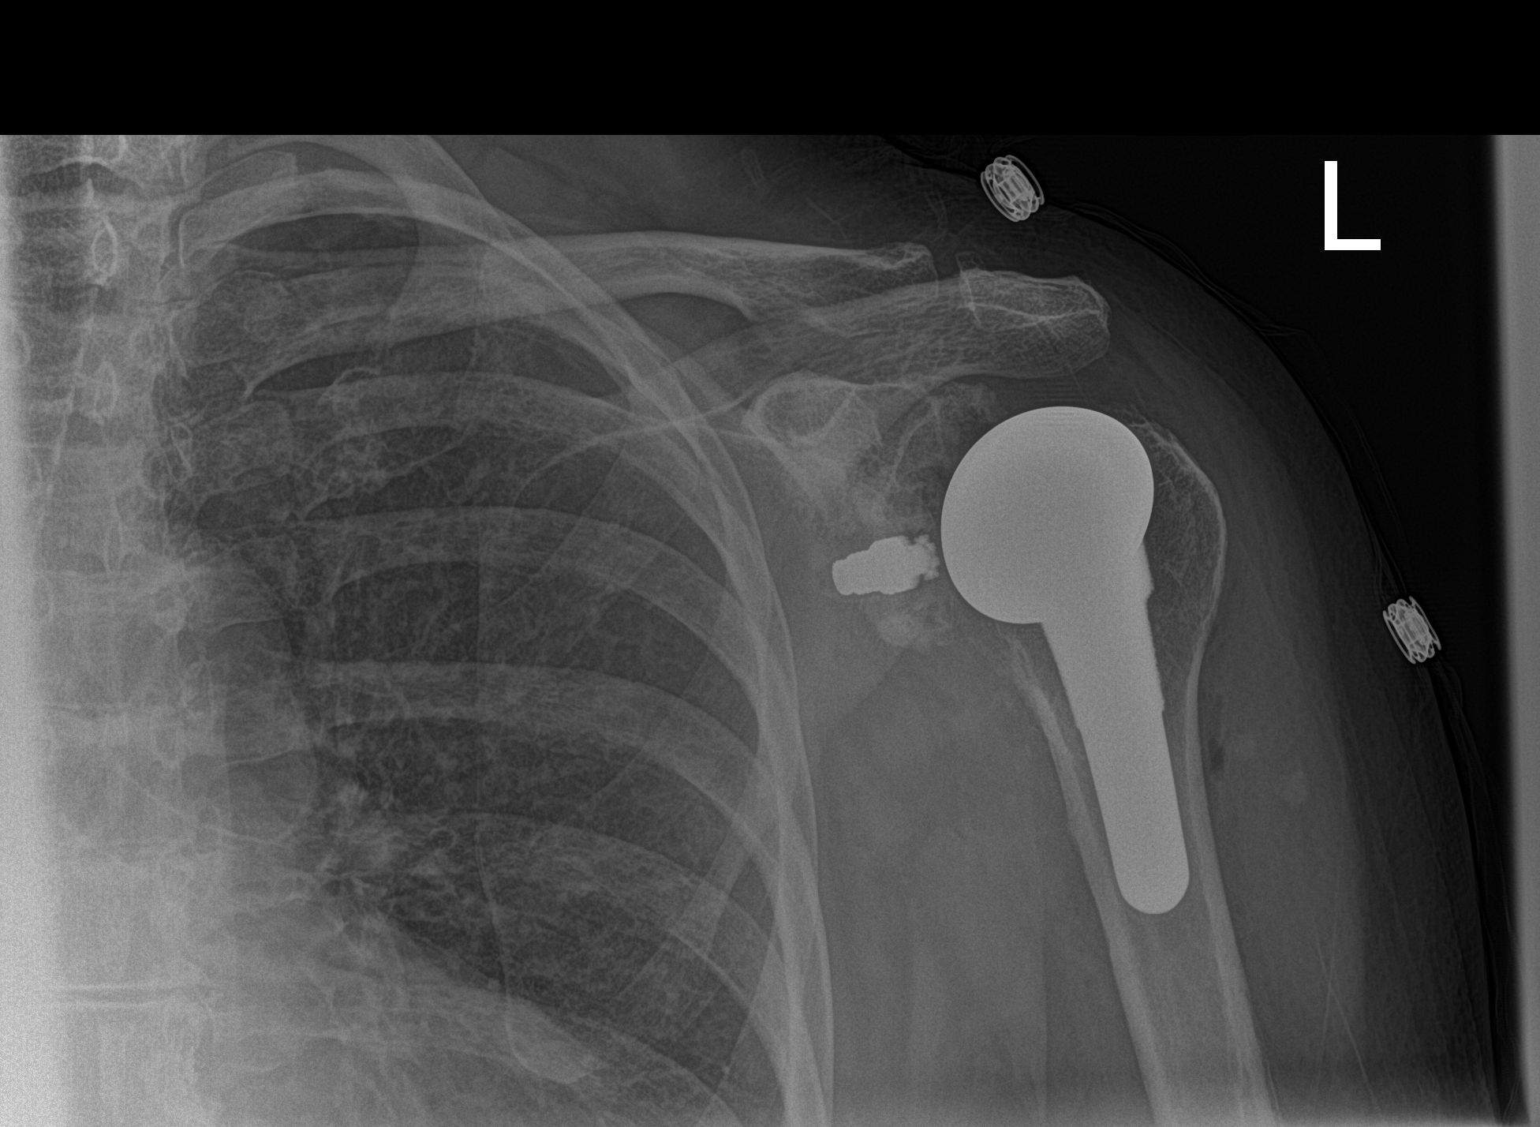

[1 of 1 positions shown; findings below may reference images not displayed]

FINDINGS: Total left shoulder replacement. Hardware intact. Anatomic
alignment.
IMPRESSION: Total left shoulder replacement with anatomic alignment.

## 2022-06-07 ENCOUNTER — Encounter (INDEPENDENT_AMBULATORY_CARE_PROVIDER_SITE_OTHER): Payer: Self-pay | Admitting: *Deleted

## 2022-06-24 DIAGNOSIS — Z299 Encounter for prophylactic measures, unspecified: Secondary | ICD-10-CM | POA: Diagnosis not present

## 2022-06-24 DIAGNOSIS — E039 Hypothyroidism, unspecified: Secondary | ICD-10-CM | POA: Diagnosis not present

## 2022-06-24 DIAGNOSIS — I10 Essential (primary) hypertension: Secondary | ICD-10-CM | POA: Diagnosis not present

## 2022-08-18 DIAGNOSIS — N390 Urinary tract infection, site not specified: Secondary | ICD-10-CM | POA: Diagnosis not present

## 2022-08-18 DIAGNOSIS — I4891 Unspecified atrial fibrillation: Secondary | ICD-10-CM | POA: Diagnosis not present

## 2022-08-18 DIAGNOSIS — Z299 Encounter for prophylactic measures, unspecified: Secondary | ICD-10-CM | POA: Diagnosis not present

## 2022-08-18 DIAGNOSIS — D6869 Other thrombophilia: Secondary | ICD-10-CM | POA: Diagnosis not present

## 2022-08-18 DIAGNOSIS — I10 Essential (primary) hypertension: Secondary | ICD-10-CM | POA: Diagnosis not present

## 2022-08-18 DIAGNOSIS — R309 Painful micturition, unspecified: Secondary | ICD-10-CM | POA: Diagnosis not present

## 2022-09-29 DIAGNOSIS — I10 Essential (primary) hypertension: Secondary | ICD-10-CM | POA: Diagnosis not present

## 2022-09-29 DIAGNOSIS — I4892 Unspecified atrial flutter: Secondary | ICD-10-CM | POA: Diagnosis not present

## 2022-09-29 DIAGNOSIS — D6869 Other thrombophilia: Secondary | ICD-10-CM | POA: Diagnosis not present

## 2022-09-29 DIAGNOSIS — Z23 Encounter for immunization: Secondary | ICD-10-CM | POA: Diagnosis not present

## 2022-09-29 DIAGNOSIS — Z299 Encounter for prophylactic measures, unspecified: Secondary | ICD-10-CM | POA: Diagnosis not present

## 2022-09-29 DIAGNOSIS — I4891 Unspecified atrial fibrillation: Secondary | ICD-10-CM | POA: Diagnosis not present

## 2022-10-29 DIAGNOSIS — Z23 Encounter for immunization: Secondary | ICD-10-CM | POA: Diagnosis not present

## 2022-12-09 DIAGNOSIS — H43813 Vitreous degeneration, bilateral: Secondary | ICD-10-CM | POA: Diagnosis not present

## 2022-12-09 DIAGNOSIS — Z961 Presence of intraocular lens: Secondary | ICD-10-CM | POA: Diagnosis not present

## 2022-12-22 DIAGNOSIS — Z79899 Other long term (current) drug therapy: Secondary | ICD-10-CM | POA: Diagnosis not present

## 2022-12-22 DIAGNOSIS — Z7189 Other specified counseling: Secondary | ICD-10-CM | POA: Diagnosis not present

## 2022-12-22 DIAGNOSIS — E039 Hypothyroidism, unspecified: Secondary | ICD-10-CM | POA: Diagnosis not present

## 2022-12-22 DIAGNOSIS — Z1331 Encounter for screening for depression: Secondary | ICD-10-CM | POA: Diagnosis not present

## 2022-12-22 DIAGNOSIS — Z0000 Encounter for general adult medical examination without abnormal findings: Secondary | ICD-10-CM | POA: Diagnosis not present

## 2022-12-22 DIAGNOSIS — E7800 Pure hypercholesterolemia, unspecified: Secondary | ICD-10-CM | POA: Diagnosis not present

## 2022-12-22 DIAGNOSIS — R5383 Other fatigue: Secondary | ICD-10-CM | POA: Diagnosis not present

## 2022-12-22 DIAGNOSIS — Z299 Encounter for prophylactic measures, unspecified: Secondary | ICD-10-CM | POA: Diagnosis not present

## 2022-12-22 DIAGNOSIS — I10 Essential (primary) hypertension: Secondary | ICD-10-CM | POA: Diagnosis not present

## 2022-12-22 DIAGNOSIS — Z1339 Encounter for screening examination for other mental health and behavioral disorders: Secondary | ICD-10-CM | POA: Diagnosis not present

## 2023-01-06 ENCOUNTER — Other Ambulatory Visit: Payer: Self-pay | Admitting: Internal Medicine

## 2023-01-06 DIAGNOSIS — Z1231 Encounter for screening mammogram for malignant neoplasm of breast: Secondary | ICD-10-CM

## 2023-01-14 DIAGNOSIS — Z13820 Encounter for screening for osteoporosis: Secondary | ICD-10-CM | POA: Diagnosis not present

## 2023-01-26 ENCOUNTER — Encounter: Payer: Self-pay | Admitting: Cardiology

## 2023-01-26 ENCOUNTER — Ambulatory Visit: Payer: Medicare Other | Attending: Cardiology | Admitting: Cardiology

## 2023-01-26 VITALS — BP 124/62 | HR 81 | Ht 65.0 in | Wt 158.4 lb

## 2023-01-26 DIAGNOSIS — I4891 Unspecified atrial fibrillation: Secondary | ICD-10-CM | POA: Insufficient documentation

## 2023-01-26 DIAGNOSIS — E782 Mixed hyperlipidemia: Secondary | ICD-10-CM | POA: Diagnosis not present

## 2023-01-26 DIAGNOSIS — I10 Essential (primary) hypertension: Secondary | ICD-10-CM | POA: Insufficient documentation

## 2023-01-26 MED ORDER — METOPROLOL SUCCINATE ER 25 MG PO TB24: 37.5000 mg | ORAL_TABLET | Freq: Every day | ORAL | 3 refills | Status: DC

## 2023-01-26 NOTE — Progress Notes (Signed)
Clinical Summary Ms. Game is a 78 y.o.female seen today for follow up of the following medical problems.      1. Permanent afib - - 2 episodes of palpitations, last about 30 minutes.  Uncomfortable during episodes to the point she would favor a medication adjustment.   EKG today rate controlled afib    2. HTN -- no recent low bp's, had been a previous issue   - compliant with meds     3. Hyperlipidemia - 12/2022 TC 174 TG 125 HDL 53 LDL 99 - she is on atorvastatin 40mg  daily      SH: good friends with Arty Baumgartner and his wife, also a patient of mine.  Past Medical History:  Diagnosis Date   Arthritis    Asthma    Atrial fibrillation ([facility])    Heart murmur    noted in 71 -no echo ever done   Hyperlipidemia    Hypertension    Hypothyroidism    Skin cancer    R arm   Wears glasses      Allergies  Allergen Reactions   Zestril [Lisinopril]     Lips swelled   Zocor [Simvastatin]     Leg cramps     Current Outpatient Medications  Medication Sig Dispense Refill   acetaminophen (TYLENOL) 500 MG tablet Take 1,000 mg by mouth every 8 (eight) hours as needed.     amLODipine (NORVASC) 5 MG tablet Take 5 mg by mouth daily.     atorvastatin (LIPITOR) 40 MG tablet TAKE 1 TABLET BY MOUTH ONCE DAILY. (Patient taking differently: Take 40 mg by mouth daily.) 30 tablet 6   Calcium Carbonate-Vit D-Min (CALCIUM 1200 PO) Take 1,200 mg by mouth daily.     chlorthalidone (HYGROTON) 25 MG tablet Take 12.5 mg by mouth daily.     CRANBERRY PO Take 500 mg by mouth daily.     Cyanocobalamin (B-12) 5000 MCG CAPS Take 1 capsule by mouth daily.     gabapentin (NEURONTIN) 100 MG capsule Take 100 mg by mouth 3 (three) times daily.     levothyroxine (SYNTHROID) 50 MCG tablet Take 50 mcg by mouth daily before breakfast.     metoprolol succinate (TOPROL-XL) 25 MG 24 hr tablet Take 0.5 tablets (12.5 mg total) by mouth 2 (two) times daily. (Patient taking differently: Take 25 mg by mouth  daily.)     Multiple Vitamins-Minerals (MULTIVITAMIN WITH MINERALS) tablet Take 1 tablet by mouth daily.     potassium chloride (KLOR-CON M) 10 MEQ tablet Take 10 mEq by mouth daily.     rivaroxaban (XARELTO) 20 MG TABS tablet Take 1 tablet (20 mg total) by mouth daily with supper. 90 tablet 3   theophylline (UNIPHYL) 600 MG 24 hr tablet Take 300 mg by mouth daily.     VENTOLIN HFA 108 (90 Base) MCG/ACT inhaler Inhale 2 puffs into the lungs every 4 (four) hours as needed for wheezing or shortness of breath.     No current facility-administered medications for this visit.     Past Surgical History:  Procedure Laterality Date   ABDOMINAL HYSTERECTOMY  2002   CARPAL TUNNEL RELEASE  2005   both rt/lt   COLONOSCOPY     GREAT TOE ARTHRODESIS, INTERPHALANGEAL JOINT  2014   right   GREAT TOE ARTHRODESIS, [place] PROCEDURE  2015   left   MENISCUS REPAIR  03/2019   PLANTAR FASCIA SURGERY  2015   left   SKIN CANCER EXCISION  R arm   TOTAL KNEE ARTHROPLASTY Right 09/02/2020   Procedure: TOTAL KNEE ARTHROPLASTY;  Surgeon: Teryl Lucy, MD;  Location: [facility];  Service: Orthopedics;  Laterality: Right;   TOTAL SHOULDER ARTHROPLASTY  2012   [facility]   TOTAL SHOULDER ARTHROPLASTY Left 05/06/2020   Procedure: TOTAL SHOULDER ARTHROPLASTY;  Surgeon: Teryl Lucy, MD;  Location: [facility];  Service: Orthopedics;  Laterality: Left;   TUBAL LIGATION       Allergies  Allergen Reactions   Zestril [Lisinopril]     Lips swelled   Zocor [Simvastatin]     Leg cramps      Family History  Problem Relation Age of Onset   Stomach cancer Father    Clotting disorder Mother        blood clot post surgery     Social History Ms. Vallecillo reports that she has never smoked. She has never used smokeless tobacco. Ms. Pullar reports that she does not currently use alcohol.   Review of Systems CONSTITUTIONAL: No weight loss, fever, chills, weakness or fatigue.  HEENT: Eyes: No visual loss, blurred  vision, double vision or yellow sclerae.No hearing loss, sneezing, congestion, runny nose or sore throat.  SKIN: No rash or itching.  CARDIOVASCULAR: per hpi RESPIRATORY: No shortness of breath, cough or sputum.  GASTROINTESTINAL: No anorexia, nausea, vomiting or diarrhea. No abdominal pain or blood.  GENITOURINARY: No burning on urination, no polyuria NEUROLOGICAL: No headache, dizziness, syncope, paralysis, ataxia, numbness or tingling in the extremities. No change in bowel or bladder control.  MUSCULOSKELETAL: No muscle, back pain, joint pain or stiffness.  LYMPHATICS: No enlarged nodes. No history of splenectomy.  PSYCHIATRIC: No history of depression or anxiety.  ENDOCRINOLOGIC: No reports of sweating, cold or heat intolerance. No polyuria or polydipsia.  Marland Kitchen   Physical Examination Today's Vitals   01/26/23 1547  BP: 124/62  Pulse: 81  SpO2: 97%  Weight: 158 lb 6.4 oz (71.8 kg)  Height: 5\' 5"  (1.651 m)   Body mass index is 26.36 kg/m.  Gen: resting comfortably, no acute distress HEENT: no scleral icterus, pupils equal round and reactive, no palptable cervical adenopathy,  CV: irreg, no mrg, no jvd Resp: Clear to auscultation bilaterally GI: abdomen is soft, non-tender, non-distended, normal bowel sounds, no hepatosplenomegaly MSK: extremities are warm, no edema.  Skin: warm, no rash Neuro:  no focal deficits Psych: appropriate affect     Assessment and Plan   1. Permanent afib/acquired thrombophilia - infrequent but uncomfortable episodes of palpitations - will increase her toprol to 37.5mg  daily. SHe will update [place] on symptoms if ongoing   2. HTN -at goal, continue current meds   3. Hyperlipidemia - she is at goal, continue current meds   Antoine Poche, M.D.

## 2023-01-26 NOTE — Patient Instructions (Signed)
Medication Instructions:  Your physician has recommended you make the following change in your medication:   Increase Toprol XL to 37.5 mg Daily   *If you need a refill on your cardiac medications before your next appointment, please call your pharmacy*   Lab Work: NONE   If you have labs (blood work) drawn today and your tests are completely normal, you will receive your results only by: MyChart Message (if you have MyChart) OR A paper copy in the mail If you have any lab test that is abnormal or we need to change your treatment, we will call you to review the results.   Testing/Procedures: NONE    Follow-Up: At [facility], you and your health needs are our priority.  As part of our continuing mission to provide you with exceptional heart care, we have created designated Provider Care Teams.  These Care Teams include your primary Cardiologist (physician) and Advanced Practice Providers (APPs -  Physician Assistants and Nurse Practitioners) who all work together to provide you with the care you need, when you need it.  We recommend signing up for the patient portal called "MyChart".  Sign up information is provided on this After Visit Summary.  MyChart is used to connect with patients for Virtual Visits (Telemedicine).  Patients are able to view lab/test results, encounter notes, upcoming appointments, etc.  Non-urgent messages can be sent to your provider as well.   To learn more about what you can do with MyChart, go to [email or web address].    Your next appointment:   1 year(s)  Provider:   You may see Dina Rich, MD or the following Advanced Practice Provider on your designated Care Team:   Sharlene Dory, NP    Other Instructions Thank you for choosing [facility]!

## 2023-02-28 ENCOUNTER — Ambulatory Visit
Admission: RE | Admit: 2023-02-28 | Discharge: 2023-02-28 | Disposition: A | Discharge status: Home / Self Care | Payer: Medicare Other | Source: Ambulatory Visit | Attending: Internal Medicine | Admitting: Internal Medicine

## 2023-02-28 DIAGNOSIS — Z1231 Encounter for screening mammogram for malignant neoplasm of breast: Secondary | ICD-10-CM

## 2023-03-15 ENCOUNTER — Other Ambulatory Visit: Payer: Self-pay | Admitting: Cardiology

## 2023-03-15 NOTE — Telephone Encounter (Signed)
 Prescription refill request for Xarelto received.  Indication:afib Last office visit:1/25 Weight:71.8  kg Age:78 Scr:0.94  12/24 CrCl:55.01  ml/min  Prescription refilled

## 2023-03-21 DIAGNOSIS — Z6827 Body mass index (BMI) 27.0-27.9, adult: Secondary | ICD-10-CM | POA: Diagnosis not present

## 2023-03-21 DIAGNOSIS — I10 Essential (primary) hypertension: Secondary | ICD-10-CM | POA: Diagnosis not present

## 2023-03-21 DIAGNOSIS — Z299 Encounter for prophylactic measures, unspecified: Secondary | ICD-10-CM | POA: Diagnosis not present

## 2023-03-21 DIAGNOSIS — I4891 Unspecified atrial fibrillation: Secondary | ICD-10-CM | POA: Diagnosis not present

## 2023-03-21 DIAGNOSIS — D6869 Other thrombophilia: Secondary | ICD-10-CM | POA: Diagnosis not present

## 2023-04-04 DIAGNOSIS — L728 Other follicular cysts of the skin and subcutaneous tissue: Secondary | ICD-10-CM | POA: Diagnosis not present

## 2023-04-04 DIAGNOSIS — I781 Nevus, non-neoplastic: Secondary | ICD-10-CM | POA: Diagnosis not present

## 2023-04-04 DIAGNOSIS — L570 Actinic keratosis: Secondary | ICD-10-CM | POA: Diagnosis not present

## 2023-05-10 DIAGNOSIS — M79674 Pain in right toe(s): Secondary | ICD-10-CM | POA: Diagnosis not present

## 2023-05-10 DIAGNOSIS — M79672 Pain in left foot: Secondary | ICD-10-CM | POA: Diagnosis not present

## 2023-05-10 DIAGNOSIS — M79675 Pain in left toe(s): Secondary | ICD-10-CM | POA: Diagnosis not present

## 2023-05-10 DIAGNOSIS — I739 Peripheral vascular disease, unspecified: Secondary | ICD-10-CM | POA: Diagnosis not present

## 2023-05-10 DIAGNOSIS — L110 Acquired keratosis follicularis: Secondary | ICD-10-CM | POA: Diagnosis not present

## 2023-05-10 DIAGNOSIS — M79671 Pain in right foot: Secondary | ICD-10-CM | POA: Diagnosis not present

## 2023-05-11 DIAGNOSIS — M25561 Pain in right knee: Secondary | ICD-10-CM | POA: Diagnosis not present

## 2023-05-11 DIAGNOSIS — M25512 Pain in left shoulder: Secondary | ICD-10-CM | POA: Diagnosis not present

## 2023-06-01 DIAGNOSIS — Z299 Encounter for prophylactic measures, unspecified: Secondary | ICD-10-CM | POA: Diagnosis not present

## 2023-06-01 DIAGNOSIS — N390 Urinary tract infection, site not specified: Secondary | ICD-10-CM | POA: Diagnosis not present

## 2023-06-01 DIAGNOSIS — I10 Essential (primary) hypertension: Secondary | ICD-10-CM | POA: Diagnosis not present

## 2023-06-01 DIAGNOSIS — R350 Frequency of micturition: Secondary | ICD-10-CM | POA: Diagnosis not present

## 2023-06-01 DIAGNOSIS — I4891 Unspecified atrial fibrillation: Secondary | ICD-10-CM | POA: Diagnosis not present

## 2023-06-01 DIAGNOSIS — D6869 Other thrombophilia: Secondary | ICD-10-CM | POA: Diagnosis not present

## 2023-06-22 DIAGNOSIS — I10 Essential (primary) hypertension: Secondary | ICD-10-CM | POA: Diagnosis not present

## 2023-06-22 DIAGNOSIS — Z299 Encounter for prophylactic measures, unspecified: Secondary | ICD-10-CM | POA: Diagnosis not present

## 2023-06-22 DIAGNOSIS — I4891 Unspecified atrial fibrillation: Secondary | ICD-10-CM | POA: Diagnosis not present

## 2023-06-22 DIAGNOSIS — D6869 Other thrombophilia: Secondary | ICD-10-CM | POA: Diagnosis not present

## 2023-07-26 DIAGNOSIS — M79675 Pain in left toe(s): Secondary | ICD-10-CM | POA: Diagnosis not present

## 2023-07-26 DIAGNOSIS — M79671 Pain in right foot: Secondary | ICD-10-CM | POA: Diagnosis not present

## 2023-07-26 DIAGNOSIS — M79674 Pain in right toe(s): Secondary | ICD-10-CM | POA: Diagnosis not present

## 2023-07-26 DIAGNOSIS — S93331D Other subluxation of right foot, subsequent encounter: Secondary | ICD-10-CM | POA: Diagnosis not present

## 2023-07-26 DIAGNOSIS — M79672 Pain in left foot: Secondary | ICD-10-CM | POA: Diagnosis not present

## 2023-07-26 DIAGNOSIS — L110 Acquired keratosis follicularis: Secondary | ICD-10-CM | POA: Diagnosis not present

## 2023-07-26 DIAGNOSIS — S93332D Other subluxation of left foot, subsequent encounter: Secondary | ICD-10-CM | POA: Diagnosis not present

## 2023-09-22 DIAGNOSIS — Z299 Encounter for prophylactic measures, unspecified: Secondary | ICD-10-CM | POA: Diagnosis not present

## 2023-09-22 DIAGNOSIS — J45909 Unspecified asthma, uncomplicated: Secondary | ICD-10-CM | POA: Diagnosis not present

## 2023-09-22 DIAGNOSIS — Z23 Encounter for immunization: Secondary | ICD-10-CM | POA: Diagnosis not present

## 2023-09-22 DIAGNOSIS — I10 Essential (primary) hypertension: Secondary | ICD-10-CM | POA: Diagnosis not present

## 2023-10-04 DIAGNOSIS — M79672 Pain in left foot: Secondary | ICD-10-CM | POA: Diagnosis not present

## 2023-10-04 DIAGNOSIS — I739 Peripheral vascular disease, unspecified: Secondary | ICD-10-CM | POA: Diagnosis not present

## 2023-10-04 DIAGNOSIS — M79674 Pain in right toe(s): Secondary | ICD-10-CM | POA: Diagnosis not present

## 2023-10-04 DIAGNOSIS — M79675 Pain in left toe(s): Secondary | ICD-10-CM | POA: Diagnosis not present

## 2023-10-04 DIAGNOSIS — M79671 Pain in right foot: Secondary | ICD-10-CM | POA: Diagnosis not present

## 2023-10-04 DIAGNOSIS — L110 Acquired keratosis follicularis: Secondary | ICD-10-CM | POA: Diagnosis not present

## 2023-10-07 DIAGNOSIS — Z23 Encounter for immunization: Secondary | ICD-10-CM | POA: Diagnosis not present

## 2023-11-02 ENCOUNTER — Other Ambulatory Visit: Payer: Self-pay | Admitting: Nurse Practitioner

## 2023-11-02 DIAGNOSIS — Z1231 Encounter for screening mammogram for malignant neoplasm of breast: Secondary | ICD-10-CM

## 2023-11-28 NOTE — Progress Notes (Signed)
 New Patient Pulmonology Office Visit   Subjective:  Patient ID: Melissa Faulkner, female    DOB: [birth year]  MRN: [id number]  Referred by: Hairfield, Keavie C, NP  CC: No chief complaint on file.   HPI ASMAA TIRPAK is a 78 y.o. female with HTN, HLD, hypothyroidism, and asthma who presents for initial evaluation for the latter.  {PULM QUESTIONNAIRES (Optional):33196}  ROS  Allergies: Zestril [lisinopril] and Zocor [simvastatin]  Current Outpatient Medications:    acetaminophen  (TYLENOL ) 500 MG tablet, Take 1,000 mg by mouth every 8 (eight) hours as needed., Disp: , Rfl:    amLODipine  (NORVASC ) 5 MG tablet, Take 5 mg by mouth daily., Disp: , Rfl:    atorvastatin  (LIPITOR) 40 MG tablet, TAKE 1 TABLET BY MOUTH ONCE DAILY. (Patient taking differently: Take 40 mg by mouth daily.), Disp: 30 tablet, Rfl: 6   Calcium  Carbonate-Vit D-Min (CALCIUM  1200 PO), Take 1,200 mg by mouth daily., Disp: , Rfl:    chlorthalidone  (HYGROTON ) 25 MG tablet, Take 12.5 mg by mouth daily., Disp: , Rfl:    CRANBERRY PO, Take 500 mg by mouth daily., Disp: , Rfl:    Cyanocobalamin (B-12) 5000 MCG CAPS, Take 1 capsule by mouth daily., Disp: , Rfl:    gabapentin (NEURONTIN) 100 MG capsule, Take 100 mg by mouth 3 (three) times daily., Disp: , Rfl:    levothyroxine  (SYNTHROID ) 50 MCG tablet, Take 50 mcg by mouth daily before breakfast., Disp: , Rfl:    metoprolol  succinate (TOPROL  XL) 25 MG 24 hr tablet, Take 1.5 tablets (37.5 mg total) by mouth daily., Disp: 135 tablet, Rfl: 3   Multiple Vitamins-Minerals (MULTIVITAMIN WITH MINERALS) tablet, Take 1 tablet by mouth daily., Disp: , Rfl:    potassium chloride  (KLOR-CON  M) 10 MEQ tablet, Take 10 mEq by mouth daily., Disp: , Rfl:    rivaroxaban  (XARELTO ) 20 MG TABS tablet, TAKE 1 TABLET BY MOUTH DAILY WITH SUPPER, Disp: 90 tablet, Rfl: 3   theophylline  (UNIPHYL) 600 MG 24 hr tablet, Take 300 mg by mouth daily., Disp: , Rfl:    VENTOLIN  HFA 108 (90 Base) MCG/ACT inhaler,  Inhale 2 puffs into the lungs every 4 (four) hours as needed for wheezing or shortness of breath., Disp: , Rfl:  Past Medical History:  Diagnosis Date   Arthritis    Asthma    Atrial fibrillation (HCC)    Heart murmur    noted in 71 -no echo ever done   Hyperlipidemia    Hypertension    Hypothyroidism    Skin cancer    R arm   Wears glasses    Past Surgical History:  Procedure Laterality Date   ABDOMINAL HYSTERECTOMY  2002   CARPAL TUNNEL RELEASE  2005   both rt/lt   COLONOSCOPY     GREAT TOE ARTHRODESIS, INTERPHALANGEAL JOINT  2014   right   GREAT TOE ARTHRODESIS, Sui PROCEDURE  2015   left   MENISCUS REPAIR  03/2019   PLANTAR FASCIA SURGERY  2015   left   SKIN CANCER EXCISION     R arm   TOTAL KNEE ARTHROPLASTY Right 09/02/2020   Procedure: TOTAL KNEE ARTHROPLASTY;  Surgeon: Josefina Chew, MD;  Location: [facility];  Service: Orthopedics;  Laterality: Right;   TOTAL SHOULDER ARTHROPLASTY  2012   right-[facility]   TOTAL SHOULDER ARTHROPLASTY Left 05/06/2020   Procedure: TOTAL SHOULDER ARTHROPLASTY;  Surgeon: Josefina Chew, MD;  Location: [facility];  Service: Orthopedics;  Laterality: Left;   TUBAL LIGATION  Family History  Problem Relation Age of Onset   Stomach cancer Father    Clotting disorder Mother        blood clot post surgery   Social History   Socioeconomic History   Marital status: Married    Spouse name: Not on file   Number of children: Not on file   Years of education: Not on file   Highest education level: Not on file  Occupational History   Not on file  Tobacco Use   Smoking status: Never   Smokeless tobacco: Never  Vaping Use   Vaping status: Never Used  Substance and Sexual Activity   Alcohol  use: Not Currently   Drug use: No   Sexual activity: Not Currently    Birth control/protection: None  Other Topics Concern   Not on file  Social History Narrative   Not on file   Social Drivers of Health   Financial Resource Strain: Low Risk   (02/02/2021)   Overall Financial Resource Strain (CARDIA)    Difficulty of Paying Living Expenses: Not hard at all  Food Insecurity: No Food Insecurity (02/02/2021)   Hunger Vital Sign    Worried About Running Out of Food in the Last Year: Never true    Ran Out of Food in the Last Year: Never true  Transportation Needs: No Transportation Needs (02/02/2021)   PRAPARE - Administrator, Civil Service (Medical): No    Lack of Transportation (Non-Medical): No  Physical Activity: Inactive (02/02/2021)   Exercise Vital Sign    Days of Exercise per Week: 0 days    Minutes of Exercise per Session: 0 min  Stress: No Stress Concern Present (02/02/2021)   [employer] of Occupational Health - Occupational Stress Questionnaire    Feeling of Stress : Not at all  Social Connections: Socially Integrated (02/02/2021)   Social Connection and Isolation Panel    Frequency of Communication with Friends and Family: More than three times a week    Frequency of Social Gatherings with Friends and Family: Twice a week    Attends Religious Services: More than 4 times per year    Active Member of [employer] or Organizations: Yes    Attends Banker Meetings: More than 4 times per year    Marital Status: Married  Catering Manager Violence: Not At Risk (02/02/2021)   Humiliation, Afraid, Rape, and Kick questionnaire    Fear of Current or Ex-Partner: No    Emotionally Abused: No    Physically Abused: No    Sexually Abused: No       Objective:  There were no vitals taken for this visit. {Pulm Vitals (Optional):32837}  Physical Exam  Diagnostic Review:  {Labs (Optional):32838}  Spirometry 2019: normal    Assessment & Plan:   Assessment & Plan   No orders of the defined types were placed in this encounter.     No follow-ups on file.   Judeth Gilles, MD

## 2023-11-29 ENCOUNTER — Ambulatory Visit (INDEPENDENT_AMBULATORY_CARE_PROVIDER_SITE_OTHER): Admitting: Pulmonary Disease

## 2023-11-29 ENCOUNTER — Encounter: Payer: Self-pay | Admitting: Pulmonary Disease

## 2023-11-29 ENCOUNTER — Ambulatory Visit (HOSPITAL_COMMUNITY)
Admission: RE | Admit: 2023-11-29 | Discharge: 2023-11-29 | Disposition: A | Discharge status: Home / Self Care | Source: Ambulatory Visit | Attending: Pulmonary Disease | Admitting: Pulmonary Disease

## 2023-11-29 VITALS — BP 133/70 | HR 74 | Ht 65.0 in | Wt 155.0 lb

## 2023-11-29 DIAGNOSIS — I517 Cardiomegaly: Secondary | ICD-10-CM | POA: Diagnosis not present

## 2023-11-29 DIAGNOSIS — J4540 Moderate persistent asthma, uncomplicated: Secondary | ICD-10-CM | POA: Diagnosis not present

## 2023-11-29 DIAGNOSIS — R059 Cough, unspecified: Secondary | ICD-10-CM | POA: Diagnosis not present

## 2023-11-29 MED ORDER — VENTOLIN HFA 108 (90 BASE) MCG/ACT IN AERS: 2.0000 | INHALATION_SPRAY | RESPIRATORY_TRACT | 6 refills | Status: AC | PRN

## 2023-11-29 MED ORDER — BREYNA 80-4.5 MCG/ACT IN AERO: 2.0000 | INHALATION_SPRAY | Freq: Two times a day (BID) | RESPIRATORY_TRACT | 12 refills | Status: AC

## 2023-11-29 NOTE — Patient Instructions (Signed)
  VISIT SUMMARY: You visited us  today due to worsening asthma symptoms after stopping Theodora. We have started you on a new inhaler and ordered some tests to better understand your condition.  YOUR PLAN: ASTHMA WITH CHRONIC COUGH AND EXERTIONAL DYSPNEA: Your asthma symptoms have worsened since you stopped taking Theodora. You are experiencing a persistent cough and difficulty breathing, especially when exerting yourself. -Start using Breyna  inhaler, 2 puffs twice a day. -Rinse your mouth or brush your teeth after using Symbicort . -Continue using your albuterol  inhaler as needed. -We have ordered a chest x-ray to check your lungs. -We have ordered a pulmonary function test to assess your breathing. -We have ordered blood work to evaluate if asthma biologic therapy is suitable for you. -Schedule a follow-up appointment in a few months to review your progress.   Contains text generated by [employer].

## 2023-11-30 ENCOUNTER — Ambulatory Visit: Payer: Self-pay | Admitting: Pulmonary Disease

## 2023-11-30 LAB — CBC WITH DIFFERENTIAL/PLATELET
Basophils Absolute: 0.1 x10E3/uL (ref 0.0–0.2)
Basos: 1 %
EOS (ABSOLUTE): 0.2 x10E3/uL (ref 0.0–0.4)
Eos: 2 %
Hematocrit: 39.6 % (ref 34.0–46.6)
Hemoglobin: 13.4 g/dL (ref 11.1–15.9)
Immature Grans (Abs): 0 x10E3/uL (ref 0.0–0.1)
Immature Granulocytes: 0 %
Lymphocytes Absolute: 1.7 x10E3/uL (ref 0.7–3.1)
Lymphs: 27 %
MCH: 31.2 pg (ref 26.6–33.0)
MCHC: 33.8 g/dL (ref 31.5–35.7)
MCV: 92 fL (ref 79–97)
Monocytes Absolute: 0.7 x10E3/uL (ref 0.1–0.9)
Monocytes: 12 %
Neutrophils Absolute: 3.6 x10E3/uL (ref 1.4–7.0)
Neutrophils: 58 %
Platelets: 196 x10E3/uL (ref 150–450)
RBC: 4.3 x10E6/uL (ref 3.77–5.28)
RDW: 12.5 % (ref 11.7–15.4)
WBC: 6.2 x10E3/uL (ref 3.4–10.8)

## 2023-12-03 LAB — IGE: IgE (Immunoglobulin E), Serum: 73 [IU]/mL (ref 6–495)

## 2023-12-14 DIAGNOSIS — Z7189 Other specified counseling: Secondary | ICD-10-CM | POA: Diagnosis not present

## 2023-12-14 DIAGNOSIS — Z1339 Encounter for screening examination for other mental health and behavioral disorders: Secondary | ICD-10-CM | POA: Diagnosis not present

## 2023-12-14 DIAGNOSIS — Z0000 Encounter for general adult medical examination without abnormal findings: Secondary | ICD-10-CM | POA: Diagnosis not present

## 2023-12-14 DIAGNOSIS — Z1331 Encounter for screening for depression: Secondary | ICD-10-CM | POA: Diagnosis not present

## 2023-12-14 DIAGNOSIS — I10 Essential (primary) hypertension: Secondary | ICD-10-CM | POA: Diagnosis not present

## 2023-12-14 DIAGNOSIS — E039 Hypothyroidism, unspecified: Secondary | ICD-10-CM | POA: Diagnosis not present

## 2023-12-14 DIAGNOSIS — Z299 Encounter for prophylactic measures, unspecified: Secondary | ICD-10-CM | POA: Diagnosis not present

## 2023-12-14 DIAGNOSIS — R5383 Other fatigue: Secondary | ICD-10-CM | POA: Diagnosis not present

## 2023-12-14 DIAGNOSIS — E7800 Pure hypercholesterolemia, unspecified: Secondary | ICD-10-CM | POA: Diagnosis not present

## 2023-12-14 DIAGNOSIS — Z79899 Other long term (current) drug therapy: Secondary | ICD-10-CM | POA: Diagnosis not present

## 2024-01-30 ENCOUNTER — Other Ambulatory Visit: Payer: Self-pay | Admitting: Cardiology

## 2024-02-06 ENCOUNTER — Ambulatory Visit: Admitting: Cardiology

## 2024-02-07 ENCOUNTER — Ambulatory Visit (HOSPITAL_COMMUNITY)
Admission: RE | Admit: 2024-02-07 | Discharge: 2024-02-07 | Disposition: A | Source: Ambulatory Visit | Attending: Pulmonary Disease | Admitting: Pulmonary Disease

## 2024-02-07 DIAGNOSIS — J4540 Moderate persistent asthma, uncomplicated: Secondary | ICD-10-CM

## 2024-02-07 LAB — PULMONARY FUNCTION TEST
FEF 25-75 Post: 1.62 L/s
FEF 25-75 Pre: 0.97 L/s
FEF2575-%Change-Post: 66 %
FEF2575-%Pred-Post: 110 %
FEF2575-%Pred-Pre: 66 %
FEV1-%Change-Post: 10 %
FEV1-%Pred-Post: 80 %
FEV1-%Pred-Pre: 72 %
FEV1-Post: 1.63 L
FEV1-Pre: 1.48 L
FEV1FVC-%Change-Post: 7 %
FEV1FVC-%Pred-Pre: 99 %
FEV6-%Change-Post: 2 %
FEV6-%Pred-Post: 79 %
FEV6-%Pred-Pre: 77 %
FEV6-Post: 2.06 L
FEV6-Pre: 2 L
FEV6FVC-%Change-Post: 0 %
FEV6FVC-%Pred-Post: 104 %
FEV6FVC-%Pred-Pre: 104 %
FVC-%Change-Post: 2 %
FVC-%Pred-Post: 75 %
FVC-%Pred-Pre: 74 %
FVC-Post: 2.07 L
FVC-Pre: 2.02 L
Post FEV1/FVC ratio: 79 %
Post FEV6/FVC ratio: 99 %
Pre FEV1/FVC ratio: 73 %
Pre FEV6/FVC Ratio: 99 %

## 2024-02-07 MED ORDER — ALBUTEROL SULFATE (2.5 MG/3ML) 0.083% IN NEBU
2.5000 mg | INHALATION_SOLUTION | Freq: Once | RESPIRATORY_TRACT | Status: AC
  Administered 2024-02-07: 2.5 mg via RESPIRATORY_TRACT

## 2024-02-14 ENCOUNTER — Ambulatory Visit: Admitting: Pulmonary Disease

## 2024-03-01 ENCOUNTER — Ambulatory Visit

## 2024-04-30 ENCOUNTER — Ambulatory Visit: Admitting: Cardiology
